# Patient Record
Sex: Female | Born: 1955 | Race: White | Hispanic: No | Smoking: Former smoker
Health system: Southern US, Community
[De-identification: ages and names within clinical notes are randomized; demographics above are authoritative.]

## PROBLEM LIST (undated history)

## (undated) DIAGNOSIS — Z789 Other specified health status: Secondary | ICD-10-CM

## (undated) DIAGNOSIS — E559 Vitamin D deficiency, unspecified: Secondary | ICD-10-CM

## (undated) DIAGNOSIS — R7303 Prediabetes: Secondary | ICD-10-CM

## (undated) DIAGNOSIS — R600 Localized edema: Secondary | ICD-10-CM

## (undated) DIAGNOSIS — D649 Anemia, unspecified: Secondary | ICD-10-CM

## (undated) DIAGNOSIS — K219 Gastro-esophageal reflux disease without esophagitis: Secondary | ICD-10-CM

## (undated) HISTORY — DX: Vitamin D deficiency, unspecified: E55.9

## (undated) HISTORY — PX: COLONOSCOPY: SHX174

## (undated) HISTORY — PX: HEMORROIDECTOMY: SUR656

## (undated) HISTORY — PX: TONSILLECTOMY: SUR1361

## (undated) HISTORY — PX: BREAST EXCISIONAL BIOPSY: SUR124

---

## 1977-11-16 HISTORY — PX: BREAST SURGERY: SHX581

## 2001-06-24 ENCOUNTER — Other Ambulatory Visit: Admission: RE | Admit: 2001-06-24 | Discharge: 2001-06-24 | Payer: Self-pay | Admitting: Gynecology

## 2001-11-16 HISTORY — PX: ABDOMINAL HYSTERECTOMY: SHX81

## 2001-11-23 ENCOUNTER — Inpatient Hospital Stay (HOSPITAL_COMMUNITY): Admission: RE | Admit: 2001-11-23 | Discharge: 2001-11-25 | Payer: Self-pay | Admitting: Gynecology

## 2001-11-23 ENCOUNTER — Encounter (INDEPENDENT_AMBULATORY_CARE_PROVIDER_SITE_OTHER): Payer: Self-pay | Admitting: Specialist

## 2002-06-22 ENCOUNTER — Encounter: Admission: RE | Admit: 2002-06-22 | Discharge: 2002-06-22 | Payer: Self-pay | Admitting: Gynecology

## 2002-06-22 ENCOUNTER — Encounter: Payer: Self-pay | Admitting: Gynecology

## 2002-07-07 ENCOUNTER — Other Ambulatory Visit: Admission: RE | Admit: 2002-07-07 | Discharge: 2002-07-07 | Payer: Self-pay | Admitting: Gynecology

## 2003-06-22 ENCOUNTER — Other Ambulatory Visit: Admission: RE | Admit: 2003-06-22 | Discharge: 2003-06-22 | Payer: Self-pay | Admitting: Gynecology

## 2003-10-16 ENCOUNTER — Encounter: Admission: RE | Admit: 2003-10-16 | Discharge: 2003-10-16 | Payer: Self-pay | Admitting: Gynecology

## 2004-10-22 ENCOUNTER — Other Ambulatory Visit: Admission: RE | Admit: 2004-10-22 | Discharge: 2004-10-22 | Payer: Self-pay | Admitting: Gynecology

## 2004-11-26 ENCOUNTER — Encounter: Admission: RE | Admit: 2004-11-26 | Discharge: 2004-11-26 | Payer: Self-pay | Admitting: Gynecology

## 2005-11-19 ENCOUNTER — Other Ambulatory Visit: Admission: RE | Admit: 2005-11-19 | Discharge: 2005-11-19 | Payer: Self-pay | Admitting: Gynecology

## 2005-12-15 ENCOUNTER — Encounter: Admission: RE | Admit: 2005-12-15 | Discharge: 2005-12-15 | Payer: Self-pay | Admitting: Gynecology

## 2006-12-17 ENCOUNTER — Other Ambulatory Visit: Admission: RE | Admit: 2006-12-17 | Discharge: 2006-12-17 | Payer: Self-pay | Admitting: Gynecology

## 2007-02-28 ENCOUNTER — Encounter: Admission: RE | Admit: 2007-02-28 | Discharge: 2007-02-28 | Payer: Self-pay | Admitting: Gynecology

## 2007-03-03 ENCOUNTER — Encounter: Admission: RE | Admit: 2007-03-03 | Discharge: 2007-03-03 | Payer: Self-pay | Admitting: Gynecology

## 2008-03-12 ENCOUNTER — Encounter: Admission: RE | Admit: 2008-03-12 | Discharge: 2008-03-12 | Payer: Self-pay | Admitting: Gynecology

## 2009-03-14 ENCOUNTER — Encounter: Admission: RE | Admit: 2009-03-14 | Discharge: 2009-03-14 | Payer: Self-pay | Admitting: Gynecology

## 2010-03-17 ENCOUNTER — Encounter: Admission: RE | Admit: 2010-03-17 | Discharge: 2010-03-17 | Payer: Self-pay | Admitting: Gynecology

## 2011-03-17 ENCOUNTER — Other Ambulatory Visit: Payer: Self-pay | Admitting: Gynecology

## 2011-03-17 DIAGNOSIS — Z1231 Encounter for screening mammogram for malignant neoplasm of breast: Secondary | ICD-10-CM

## 2011-03-24 ENCOUNTER — Other Ambulatory Visit: Payer: Self-pay | Admitting: Gynecology

## 2011-04-03 ENCOUNTER — Ambulatory Visit: Payer: Self-pay

## 2011-04-03 NOTE — Discharge Summary (Signed)
[facility]  Patient:    Donna Baker, RYLE Visit Number: [id number] MRN: [id number]          Service Type: Attending:  Gretta Cool, M.D. Dictated by:   Jeani Sow, F.N.P. Adm. Date:  11/23/01 Disc. Date: 11/25/01   CC:         [facility]   Discharge Summary  HISTORY OF PRESENT ILLNESS:  The patient is a 55 year old female, gravida 3, para 3, AB 1, with a history of increasingly severe menorrhagia with extremely heavy periods for three days associated with severe cramping.  She also reports bladder pressure, frequency, nocturia, which developed over the last six months.  Ultrasound evaluation documented a very large luminal leiomyomata bulging into the cavity, and accessible by hysteroscopy.  The uterus is approximately 12 to 14 weeks in size, grossly irregular, with a large posterior wall myoma.  She wishes to proceed with hysterectomy for definitive therapy, but wishes to preserve ovarian function.  PHYSICAL EXAMINATION:  CHEST:  Clear to auscultation and percussion.  HEART:  Regular rate and rhythm, without murmur, gallop, or cardiac enlargement.  ABDOMEN:  Soft and scaphoid without masses or organomegaly.  PELVIC:  External genitalia within normal limits for female.  Vagina clean and rugous.  Excellent pelvic support.  Cervix is clean.  Uterus is approximately 12 weeks in size, grossly irregular with fibroids deviating the uterus posteriorly.  Adnexa nonpalpable.  Rectovaginal examination confirms.  IMPRESSION: 1. Uterine leiomyomata with abnormal uterine bleeding. 2. Nocturia frequently. 3. Luminal by ultrasound.  PLAN:  Total abdominal hysterectomy, possible salpingo-oophorectomy.  Risks and benefits were discussed with the patient, and she accepts these procedures.  LABORATORY DATA:  Admission hemoglobin 11.0, hematocrit 32.0.  On the first postoperative day, hemoglobin was 8.6, hematocrit 25.4.  Blood type  A-, with a negative antibody screen.  HOSPITAL COURSE:  The patient underwent total abdominal hysterectomy under general anesthesia.  The procedure was completed without any complications, and the patient was returned to the recovery room in excellent condition. Pathology report showed benign proliferative phase endometrium with a benign endometrial polyp, leiomyomata and adenomyosis, serosal fibrous adhesions. Her postoperative course was without complications, and the patient was discharged to home on the second postoperative day in excellent condition.  It was noted on the day of discharge the hemoglobin had dropped more than expected with estimated loss at surgery.  The patient was not pale, and was not having any difficulties, so she was discharged to home.  ACTIVITY:  No heavy lifting or straining, no vaginal entrance, increase ambulation as tolerated.  DISCHARGE INSTRUCTIONS:  She is to call for fever of over 100.5, or failure of daily improvement.  DIET:  Regular.  DISCHARGE MEDICATIONS: 1. Tylox one p.o. q.4h. p.r.n. discomfort. 2. Vioxx 25 mg q.d.  FOLLOWUP:  She is to return to the office in one week for followup.  CONDITION ON DISCHARGE:  Excellent.  FINAL DISCHARGE DIAGNOSES: 1. Uterine leiomyomata with abnormal uterine bleeding, luminal. 2. Benign endometrial polyp. 3. Adenomyosis and serosal fibrous adhesions.  PROCEDURES PERFORMED:  Total abdominal hysterectomy under general anesthesia. Dictated by:   Jeani Sow, F.N.P. Attending:  Gretta Cool, M.D. DD:  12/12/01 TD:  12/12/01 Job: [id number]

## 2011-04-03 NOTE — H&P (Signed)
[facility]  Patient:    Donna Baker, Donna Baker Visit Number: [id number] MRN: [id number]          Service Type: Attending:  Gretta Cool, M.D. Dictated by:   Gretta Cool, M.D. Adm. Date:  11/24/01   CC:         [facility]   History and Physical  CHIEF COMPLAINT: 1. Abdomen uterine bleeding. 2. Pelvic pressure. 3. Nocturia frequency.  HISTORY OF PRESENT ILLNESS:  This is a 55 year old gravida 3, para 2, ab 1 with a history of increasing severe menorrhagia with extremely heavy bleeding for the first three days of her cycle associated with severe cramps.  She is also having bladder pressure, frequency, nocturia, which developed over the last 6 months.  She has ultrasound documentation of a very large luminal leiomyoma bulging into the cavity and accessible by hysteroscopy.  Her uterus is approximately 12-14 weeks size, grossly irregular, with a large posterior wall myoma.  She wishes to proceed to hysterectomy for definitive therapy, wishes ovarian preservation.  She understands the alternative forms of therapy including medical suppression, embolization etc.  PAST MEDICAL HISTORY:  Usual childhood diseases without sequelae.  MEDICAL ILLNESSES:  None of consequence.  ACCIDENTS/INJURIES:  None.  ALLERGIES:  None.  PREVIOUS SURGERIES: 1. C-section x 2. 2. Hemorrhoidectomy, Dr. Earlene Plater. 3. T&A as a child.  FAMILY HISTORY:  Father died of liver cancer at age 61.  Mother is 87, living and well.  No other known familial tendency to disease.  SOCIAL HISTORY:  Denies ethanol, tobacco, or recreational drugs.  She is a divorced mother of two, with one child living at home.  REVIEW OF SYSTEMS:  HEENT:  Denies symptoms.  CARDIORESPIRATORY:  Denies asthma, cough, bronchitis, shortness of breath.  GI/GU:  Denies frequency, urgency, change in bowel habits, food intolerance.  There is nocturia as above.  PHYSICAL  EXAMINATION:  GENERAL:  Well-developed, short, white female, moderately over ideal weight.  HEENT:  Pupils, equal, round, reactive to light and accommodation.  Fundi are benign.  Oropharynx is clear.  NECK:  Supple without masses or thyroid enlargement.  CHEST:  Clear to percussion and auscultation.  HEART:  Regular rhythm without murmur or cardiac enlargement.  BREASTS:  Soft without mass, nodes, nipple discharge.  Node bearing areas are negative.  ABDOMEN:  Soft, scaphoid without mass or organomegaly.  PELVIC:  External genitalia:  Normal female.  Vagina:  Clean and rugous, excellent pelvic support.  CERVIX:  Clean.  UTERUS:  Approximately 12 weeks size, grossly irregular with fibroids deviating the uterus posteriorly.  Adnexa nonpalpable.  Rectovaginal examination confirms.  IMPRESSION: 1. Uterine leiomyomata with abnormal uterine bleeding.  Nocturia frequency. 2. Luminal by ultrasound  PLAN:  Total abdominal hysterectomy, possible salpingo-oophorectomy.  NOTE:  She wishes ovarian preservation if possible.  Risks, benefit ratio discussed in detail.  Alternative therapies. Dictated by:   Gretta Cool, M.D. Attending:  Gretta Cool, M.D. DD:  11/24/01 TD:  11/25/01 Job: [id number]

## 2011-04-03 NOTE — Op Note (Signed)
[facility]  Patient:    Donna Baker, Donna Baker [facility] Visit Number: [id number] MRN: [id number]          Service Type: GYN Location: 4W [id number] 01 Attending Physician:  Katrina Stack Dictated by:   Gretta Cool, M.D. Proc. Date: 11/23/01 Admit Date:  11/23/2001   CC:         [facility]   Operative Report  PREOPERATIVE DIAGNOSIS:  Uterine leiomyomata with abnormal uterine bleeding, luminal.  POSTOPERATIVE DIAGNOSIS:  Uterine leiomyomata with abnormal uterine bleeding, luminal.  PROCEDURES:  Total abdominal hysterectomy.  SURGEON:  Gretta Cool, M.D.  ASSISTANT:  Raynald Kemp, M.D.  ANESTHESIA:  General orotracheal.  DESCRIPTION OF PROCEDURE:  Under excellent general anesthesia with the patients abdomen prepped and draped as a sterile field and Foley catheter draining the bladder, a Pfannenstiel incision was made by excision of her previous Pfannenstiel scar.  The incision was extended through the fascia. The peritoneum was opened and the abdomen explored.  No abnormalities identified in the upper abdomen.  Examination of the pelvis revealed a huge posterior wall myoma and a smaller one on the right lateral uterine wall. There was minimal adhesion from previous cesarean sections, multiple.  The round ligaments were then sutured and transected.  The anterior leaf of the broad ligament was opened and the bladder pushed off the lower segment.  The ovarian ligaments were then clamped, cut, sutured, and tied with 0 Vicryl. The uterine vessels were then skeletonized, clamped, cut, and tied.  The cardinal uterosacral ligaments were then clamped, cut, sutured, and tied with 0 Vicryl.  The uterosacral ligaments were then clamped, cut, sutured, and tied with 0 Vicryl.  The cervix was then excised.  The cuff was closed with a running suture of 0 Vicryl.  At this point, the pelvic floor was reperitonealized with a running suture  of 2-0 Monocryl.  The packs and retractors were then removed.  The pelvis was irrigated.  The abdominal peritoneum was closed with a running suture of 0 Monocryl.  The fascia was approximated with interrupted sutures of 0 Vicryl.  The subcutaneous tissues were approximated with interrupted sutures of 3-0 Vicryl.  The skin was sutured with subcuticular 5-0 Vicryl and skin staples and Steri-Strips.  At the end of the procedure, the sponge and lap counts were correct.  No complications.  The patient returned to the recovery room in excellent condition. Dictated by:   Gretta Cool, M.D. Attending Physician:  Katrina Stack DD:  11/23/01 TD:  11/23/01 Job: [id number]

## 2011-04-15 ENCOUNTER — Ambulatory Visit
Admission: RE | Admit: 2011-04-15 | Discharge: 2011-04-15 | Disposition: A | Discharge status: Home / Self Care | Payer: PRIVATE HEALTH INSURANCE | Source: Ambulatory Visit | Attending: Gynecology | Admitting: Gynecology

## 2011-04-15 DIAGNOSIS — Z1231 Encounter for screening mammogram for malignant neoplasm of breast: Secondary | ICD-10-CM

## 2012-03-30 ENCOUNTER — Other Ambulatory Visit: Payer: Self-pay | Admitting: Gynecology

## 2012-03-30 DIAGNOSIS — Z1231 Encounter for screening mammogram for malignant neoplasm of breast: Secondary | ICD-10-CM

## 2012-04-25 ENCOUNTER — Ambulatory Visit: Payer: PRIVATE HEALTH INSURANCE

## 2012-04-27 ENCOUNTER — Ambulatory Visit: Payer: PRIVATE HEALTH INSURANCE

## 2012-05-13 ENCOUNTER — Ambulatory Visit
Admission: RE | Admit: 2012-05-13 | Discharge: 2012-05-13 | Disposition: A | Discharge status: Home / Self Care | Payer: No Typology Code available for payment source | Source: Ambulatory Visit | Attending: Gynecology | Admitting: Gynecology

## 2012-05-13 DIAGNOSIS — Z1231 Encounter for screening mammogram for malignant neoplasm of breast: Secondary | ICD-10-CM

## 2012-06-02 ENCOUNTER — Other Ambulatory Visit: Payer: Self-pay | Admitting: Gynecology

## 2012-06-15 ENCOUNTER — Other Ambulatory Visit: Payer: Self-pay | Admitting: Gastroenterology

## 2012-07-20 ENCOUNTER — Other Ambulatory Visit: Payer: Self-pay | Admitting: Gynecology

## 2012-07-20 DIAGNOSIS — N644 Mastodynia: Secondary | ICD-10-CM

## 2012-07-20 DIAGNOSIS — N63 Unspecified lump in unspecified breast: Secondary | ICD-10-CM

## 2012-07-22 ENCOUNTER — Ambulatory Visit
Admission: RE | Admit: 2012-07-22 | Discharge: 2012-07-22 | Disposition: A | Discharge status: Home / Self Care | Payer: No Typology Code available for payment source | Source: Ambulatory Visit | Attending: Gynecology | Admitting: Gynecology

## 2012-07-22 DIAGNOSIS — N644 Mastodynia: Secondary | ICD-10-CM

## 2012-07-22 DIAGNOSIS — N63 Unspecified lump in unspecified breast: Secondary | ICD-10-CM

## 2012-08-16 ENCOUNTER — Encounter (INDEPENDENT_AMBULATORY_CARE_PROVIDER_SITE_OTHER): Payer: Self-pay | Admitting: Surgery

## 2012-08-16 ENCOUNTER — Ambulatory Visit (INDEPENDENT_AMBULATORY_CARE_PROVIDER_SITE_OTHER): Payer: No Typology Code available for payment source | Admitting: Surgery

## 2012-08-16 VITALS — BP 118/62 | HR 73 | Temp 98.2°F | Resp 18 | Ht 60.0 in | Wt 136.4 lb

## 2012-08-16 DIAGNOSIS — N6019 Diffuse cystic mastopathy of unspecified breast: Secondary | ICD-10-CM | POA: Insufficient documentation

## 2012-08-16 DIAGNOSIS — N644 Mastodynia: Secondary | ICD-10-CM

## 2012-08-16 NOTE — Progress Notes (Signed)
NAME: Donna Baker DOB: 1956/08/13 MRN: [id number]                                                                                      DATE: 08/16/2012  PCP: Gretta Cool, MD Referring Provider: Gretta Cool, MD  IMPRESSION:  Fibrocystic disease with prominent area in right breast upper outer quadrant  PLAN:   I told her I think that this is a benign area of fibrocystic change. It is not much different that wasn't my exam several years ago. Negative mammogram, ultrasound, and 3-D mammogram all make me feel more comfortable that this is benign.I feel will be okay for her gynecologist put her on some medications to help control her hot flashes.                CC:  Chief Complaint  Patient presents with  . Breast Pain    Right    HPI:  Donna Baker is a 56 y.o.  female who presents for evaluation of a possible right breast mass. I saw her in 2000 and on for a possible right breast mass upper outer quadrant. The area has been stable as far as the patient can tell. However she recent had some increasing pain in that area and has been further evaluated. In June she had a toe mammogram that was negative. In September she had ultrasound and a new right-sided mammogram both of which are negative. She notes her pain has gotten better. Her gynecologist continues to note an area about 3 cm in the 10:00 position that feels different than the rest of her breasts.  PMH:  has no past medical history on file.  PSH:   has past surgical history that includes Abdominal hysterectomy; Cesarean section; and Breast surgery.  ALLERGIES:  Not on File  MEDICATIONS: Current outpatient prescriptions:triamterene-hydrochlorothiazide (MAXZIDE) 75-50 MG per tablet, Take 1 tablet by mouth once a week., Disp: , Rfl:   ROS: She has filled out our 12 point review of systems and it is negative. EXAM:   Vital signs:BP 118/62  Pulse 73  Temp 98.2 F (36.8 C) (Temporal)  Resp 18  Ht 5' (1.524 m)  Wt  136 lb 6.4 oz (61.871 kg)  BMI 26.64 kg/m2  SpO2 97%  General: Patient alert oriented healthy-appearing, NAD.  Breasts: The breasts are symmetric and overall appearance. There is a small ecchymosis perhaps 2 cm high in the right breast( the patient relates this to some deep tissue massage she received over the weekend). There is some increased nodularity in the clock position as previously documented and noted by her gynecologist. I think this represents fibrocystic change. There no areas that are particularly hard. The skin and nipple areas are normal bilaterally. Lymphatics: There is no axillary supraclavicular adenopathy noted.  DATA REVIEWED:  I reviewed the notes from Dr. Nicholas Lose. In addition I reviewed the mammogram and all she reports as well as my old office notes.    Aarin Sparkman J 08/16/2012  CC: Gretta Cool, MD, Gretta Cool, MD

## 2012-08-16 NOTE — Patient Instructions (Signed)
If you notice any other changes in your breast come back for another visit

## 2013-01-17 ENCOUNTER — Encounter (HOSPITAL_BASED_OUTPATIENT_CLINIC_OR_DEPARTMENT_OTHER): Payer: Self-pay | Admitting: *Deleted

## 2013-01-17 NOTE — Progress Notes (Signed)
Pt takes maxide once a month for leg edema if she flighs-did not order istat -

## 2013-01-17 NOTE — Progress Notes (Signed)
No cardiac or resp problems 

## 2013-01-18 NOTE — H&P (Signed)
[facility] [address]   [address] [place], [place] [id number] [phone] A Division of [facility]  Loreta Ave, M.D.   Robert A. Thurston Hole, M.D.   Burnell Blanks, M.D.   Eulas Post, M.D.   Lunette Stands, M.D Buford Dresser, M.D.  Charlsie Quest, M.D.   Estell Harpin, M.D.   Melina Fiddler, M.D. Genene Churn. Barry Dienes, PA-C            Kirstin A. Shepperson, PA-C Josh Amazonia, PA-C [place], [place]   RE: Rosmery, [place]                                [id number]      DOB: 21-May-1956 INITIAL EVALUATION:  09-23-12 Chief complaint: Right shoulder pain.  History of present illness: 57 year-old white female who is a new patient to the office and presents with the above complaint.  Shoulder pain ongoing for a couple of months.  Denies previous issues before onset.  No specific injury that she can recall.  No cervical spine or radicular component.  Pain aggravated with overhead activity and reaching behind her back.  She is very active with working out at [employer], but has been limited over the last several weeks.  She states "if I could just get something to support my shoulder".  Most of her discomfort is localized to the anterior aspect, but does have lateral discomfort.  No feeling of instability.  Pain does awaken her at night when she lies on her right side.   Current medications: Aleve. Allergies: No known drug allergies. Past medical/surgical history: Hysterectomy. Review of systems: All other systems unremarkable. Family history: Healthy. Social history: Denies smoking, admits occasional alcohol use.  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      EXAMINATION: Height: 5?0.  Weight: 134 pounds.  Pulse: 71.  Blood pressure: 120/75.  Pleasant white female, alert and oriented x 3 and in no acute distress.  Gait is normal.  Cervical spine good range of motion.  Right shoulder she  has good range of motion , but with discomfort overhead and with internal rotation behind her back.  Positive impingement test.  Tender over the proximal biceps tendon without defect.  Negative drop arm.  Pain and some weakness with supraspinatus resistance.  Neurovascularly intact.  Skin warm and dry.     X-RAYS: Right shoulder, AP, outlet and axillary views, show some AC DJD and a large subacromial spur which could be an extra acromion.    IMPRESSION: Right shoulder pain secondary to impingement with large subacromial spur.    PLAN: Since her pain has only been going on for a couple of months we will attempt conservative treatment with injection.  She is advised to pay close attention to how she feels over the next several hours.  Follow up in 3-4 weeks for recheck, but if she is not any better before that appointment she will call and let me know and I will schedule an MRI scan.  All questions answered.    Continued   Ioanna Colquhoun/[facility] [address]   [address] [place], [place] [address] [phone] A Division of [facility]  Loreta Ave, M.D.   Robert A. Thurston Hole, M.D.   Burnell Blanks, M.D.   Eulas Post, M.D.   Lunette Stands, M.D  Buford Dresser, M.D.  Charlsie Quest, M.D.   Estell Harpin, M.D.   Melina Fiddler, M.D. Genene Churn. Barry Dienes, PA-C            Kirstin A. Shepperson, PA-C Josh Bastrop, PA-C [place], [place]   RE: Brande, [place]                                [id number]      DOB: [birth year] PROGRESS NOTE: 09-23-12 PROCEDURE NOTE: The patient's clinical condition is marked by substantial pain and/or significant functional disability.  Other conservative therapy has not provided relief, is contraindicated, or not appropriate.  There is a reasonable likelihood that injection will significantly improve the patient's pain and/or functional disability. After patient consent the right shoulder was prepped  with Betadine after using 3 cc of 1% Xylocaine for local anesthetic, subacromial 1:5 Depo-Medrol/Marcaine injection performed from a posterolateral approach.  Tolerated procedure well without complication.   Loreta Ave, M.D.   Electronically verified by Loreta Ave, M.D. DFM(JMO):jjh D 09-26-12 T 09-27-12  MRI completed 01/11/13 showing small full thickness tear supraspinatous tendon, impingement, bursitis. Persistent symptoms. Proceeding with scope, decompression and cuff repair.

## 2013-01-19 ENCOUNTER — Encounter (HOSPITAL_BASED_OUTPATIENT_CLINIC_OR_DEPARTMENT_OTHER)
Admission: RE | Disposition: A | Discharge status: Home / Self Care | Payer: Self-pay | Source: Ambulatory Visit | Attending: Orthopedic Surgery

## 2013-01-19 ENCOUNTER — Ambulatory Visit (HOSPITAL_BASED_OUTPATIENT_CLINIC_OR_DEPARTMENT_OTHER): Payer: No Typology Code available for payment source | Admitting: Anesthesiology

## 2013-01-19 ENCOUNTER — Encounter (HOSPITAL_BASED_OUTPATIENT_CLINIC_OR_DEPARTMENT_OTHER): Payer: Self-pay | Admitting: Anesthesiology

## 2013-01-19 ENCOUNTER — Ambulatory Visit (HOSPITAL_BASED_OUTPATIENT_CLINIC_OR_DEPARTMENT_OTHER)
Admission: RE | Admit: 2013-01-19 | Discharge: 2013-01-19 | Disposition: A | Discharge status: Home / Self Care | Payer: No Typology Code available for payment source | Source: Ambulatory Visit | Attending: Orthopedic Surgery | Admitting: Orthopedic Surgery

## 2013-01-19 DIAGNOSIS — M719 Bursopathy, unspecified: Secondary | ICD-10-CM | POA: Insufficient documentation

## 2013-01-19 DIAGNOSIS — M67919 Unspecified disorder of synovium and tendon, unspecified shoulder: Secondary | ICD-10-CM | POA: Insufficient documentation

## 2013-01-19 DIAGNOSIS — M24819 Other specific joint derangements of unspecified shoulder, not elsewhere classified: Secondary | ICD-10-CM | POA: Insufficient documentation

## 2013-01-19 DIAGNOSIS — Z9889 Other specified postprocedural states: Secondary | ICD-10-CM

## 2013-01-19 DIAGNOSIS — M25819 Other specified joint disorders, unspecified shoulder: Secondary | ICD-10-CM | POA: Insufficient documentation

## 2013-01-19 HISTORY — DX: Localized edema: R60.0

## 2013-01-19 HISTORY — PX: SHOULDER ARTHROSCOPY WITH ROTATOR CUFF REPAIR AND SUBACROMIAL DECOMPRESSION: SHX5686

## 2013-01-19 HISTORY — DX: Other specified health status: Z78.9

## 2013-01-19 LAB — POCT HEMOGLOBIN-HEMACUE: Hemoglobin: 14.3 g/dL (ref 12.0–15.0)

## 2013-01-19 SURGERY — SHOULDER ARTHROSCOPY WITH ROTATOR CUFF REPAIR AND SUBACROMIAL DECOMPRESSION
Anesthesia: General | Site: Shoulder | Laterality: Right | Wound class: Clean

## 2013-01-19 MED ORDER — PROPOFOL 10 MG/ML IV BOLUS
INTRAVENOUS | Status: DC | PRN
  Administered 2013-01-19: 140 mg via INTRAVENOUS

## 2013-01-19 MED ORDER — OXYCODONE HCL 5 MG/5ML PO SOLN: 5.0000 mg | Freq: Once | ORAL | Status: DC | PRN

## 2013-01-19 MED ORDER — DEXAMETHASONE SODIUM PHOSPHATE 4 MG/ML IJ SOLN
INTRAMUSCULAR | Status: DC | PRN
  Administered 2013-01-19: 10 mg via INTRAVENOUS

## 2013-01-19 MED ORDER — OXYCODONE HCL 5 MG PO TABS: 5.0000 mg | ORAL_TABLET | Freq: Once | ORAL | Status: DC | PRN

## 2013-01-19 MED ORDER — LIDOCAINE HCL (CARDIAC) 20 MG/ML IV SOLN
INTRAVENOUS | Status: DC | PRN
  Administered 2013-01-19: 40 mg via INTRAVENOUS

## 2013-01-19 MED ORDER — CEFAZOLIN SODIUM-DEXTROSE 2-3 GM-% IV SOLR
2.0000 g | INTRAVENOUS | Status: AC
Start: 2013-01-19 — End: 2013-01-19
  Administered 2013-01-19: 2 g via INTRAVENOUS

## 2013-01-19 MED ORDER — SUCCINYLCHOLINE CHLORIDE 20 MG/ML IJ SOLN
INTRAMUSCULAR | Status: DC | PRN
  Administered 2013-01-19: 80 mg via INTRAVENOUS

## 2013-01-19 MED ORDER — BUPIVACAINE-EPINEPHRINE PF 0.5-1:200000 % IJ SOLN
INTRAMUSCULAR | Status: DC | PRN
  Administered 2013-01-19: 25 mL

## 2013-01-19 MED ORDER — MIDAZOLAM HCL 2 MG/2ML IJ SOLN
1.0000 mg | INTRAMUSCULAR | Status: DC | PRN
  Administered 2013-01-19: 2 mg via INTRAVENOUS

## 2013-01-19 MED ORDER — OXYCODONE-ACETAMINOPHEN 5-325 MG PO TABS: 1.0000 | ORAL_TABLET | ORAL | Status: DC | PRN

## 2013-01-19 MED ORDER — SODIUM CHLORIDE 0.9 % IR SOLN
Status: DC | PRN
  Administered 2013-01-19: 6000 mL

## 2013-01-19 MED ORDER — HYDROMORPHONE HCL PF 1 MG/ML IJ SOLN
0.2500 mg | INTRAMUSCULAR | Status: DC | PRN
  Administered 2013-01-19 (×2): 0.5 mg via INTRAVENOUS

## 2013-01-19 MED ORDER — FENTANYL CITRATE 0.05 MG/ML IJ SOLN
50.0000 ug | INTRAMUSCULAR | Status: DC | PRN
  Administered 2013-01-19: 100 ug via INTRAVENOUS

## 2013-01-19 MED ORDER — ONDANSETRON HCL 4 MG/2ML IJ SOLN
INTRAMUSCULAR | Status: DC | PRN
  Administered 2013-01-19: 4 mg via INTRAVENOUS

## 2013-01-19 MED ORDER — LACTATED RINGERS IV SOLN
INTRAVENOUS | Status: DC
  Administered 2013-01-19 (×2): via INTRAVENOUS

## 2013-01-19 SURGICAL SUPPLY — 73 items
ANCH SUT SWLK 19.1X5.5 CLS EL (Anchor) ×2 IMPLANT
ANCHOR PEEK SWIVEL LOCK 5.5 (Anchor) ×2 IMPLANT
APL SKNCLS STERI-STRIP NONHPOA (GAUZE/BANDAGES/DRESSINGS)
BENZOIN TINCTURE PRP APPL 2/3 (GAUZE/BANDAGES/DRESSINGS) IMPLANT
BLADE CUTTER GATOR 3.5 (BLADE) ×2 IMPLANT
BLADE CUTTER MENIS 5.5 (BLADE) IMPLANT
BLADE GREAT WHITE 4.2 (BLADE) ×2 IMPLANT
BLADE SURG 15 STRL LF DISP TIS (BLADE) IMPLANT
BLADE SURG 15 STRL SS (BLADE)
BUR OVAL 6.0 (BURR) ×2 IMPLANT
CANISTER OMNI JUG 16 LITER (MISCELLANEOUS) ×2 IMPLANT
CANISTER SUCTION 2500CC (MISCELLANEOUS) IMPLANT
CANNULA DRY DOC 8X75 (CANNULA) ×1 IMPLANT
CANNULA TWIST IN 8.25X7CM (CANNULA) IMPLANT
CLOTH BEACON ORANGE TIMEOUT ST (SAFETY) ×2 IMPLANT
DECANTER SPIKE VIAL GLASS SM (MISCELLANEOUS) IMPLANT
DRAPE OEC MINIVIEW 54X84 (DRAPES) IMPLANT
DRAPE STERI 35X30 U-POUCH (DRAPES) ×2 IMPLANT
DRAPE U-SHAPE 47X51 STRL (DRAPES) ×2 IMPLANT
DRAPE U-SHAPE 76X120 STRL (DRAPES) ×4 IMPLANT
DRSG PAD ABDOMINAL 8X10 ST (GAUZE/BANDAGES/DRESSINGS) ×2 IMPLANT
DURAPREP 26ML APPLICATOR (WOUND CARE) ×2 IMPLANT
ELECT MENISCUS 165MM 90D (ELECTRODE) ×2 IMPLANT
ELECT NDL TIP 2.8 STRL (NEEDLE) IMPLANT
ELECT NEEDLE TIP 2.8 STRL (NEEDLE) IMPLANT
ELECT REM PT RETURN 9FT ADLT (ELECTROSURGICAL) ×2
ELECTRODE REM PT RTRN 9FT ADLT (ELECTROSURGICAL) ×1 IMPLANT
GAUZE XEROFORM 1X8 LF (GAUZE/BANDAGES/DRESSINGS) ×2 IMPLANT
GLOVE BIO SURGEON STRL SZ 6.5 (GLOVE) ×1 IMPLANT
GLOVE BIOGEL PI IND STRL 7.0 (GLOVE) IMPLANT
GLOVE BIOGEL PI IND STRL 8 (GLOVE) ×1 IMPLANT
GLOVE BIOGEL PI INDICATOR 7.0 (GLOVE) ×1
GLOVE BIOGEL PI INDICATOR 8 (GLOVE) ×1
GLOVE ORTHO TXT STRL SZ7.5 (GLOVE) ×4 IMPLANT
GOWN PREVENTION PLUS XLARGE (GOWN DISPOSABLE) ×2 IMPLANT
GOWN STRL REIN 2XL XLG LVL4 (GOWN DISPOSABLE) ×2 IMPLANT
NDL SCORPION MULTI FIRE (NEEDLE) IMPLANT
NDL SUT 6 .5 CRC .975X.05 MAYO (NEEDLE) IMPLANT
NEEDLE MAYO TAPER (NEEDLE)
NEEDLE SCORPION MULTI FIRE (NEEDLE) ×2 IMPLANT
NS IRRIG 1000ML POUR BTL (IV SOLUTION) IMPLANT
PACK ARTHROSCOPY DSU (CUSTOM PROCEDURE TRAY) ×2 IMPLANT
PACK BASIN DAY SURGERY FS (CUSTOM PROCEDURE TRAY) ×2 IMPLANT
PASSER SUT SWANSON 36MM LOOP (INSTRUMENTS) IMPLANT
PENCIL BUTTON HOLSTER BLD 10FT (ELECTRODE) ×2 IMPLANT
SET ARTHROSCOPY TUBING (MISCELLANEOUS) ×2
SET ARTHROSCOPY TUBING LN (MISCELLANEOUS) ×1 IMPLANT
SLEEVE SCD COMPRESS KNEE MED (MISCELLANEOUS) ×1 IMPLANT
SLING ARM FOAM STRAP LRG (SOFTGOODS) IMPLANT
SLING ARM FOAM STRAP MED (SOFTGOODS) IMPLANT
SLING ARM FOAM STRAP XLG (SOFTGOODS) IMPLANT
SLING ARM IMMOBILIZER LRG (SOFTGOODS) IMPLANT
SLING ARM IMMOBILIZER MED (SOFTGOODS) IMPLANT
SPONGE GAUZE 4X4 12PLY (GAUZE/BANDAGES/DRESSINGS) ×4 IMPLANT
SPONGE LAP 4X18 X RAY DECT (DISPOSABLE) IMPLANT
STRIP CLOSURE SKIN 1/2X4 (GAUZE/BANDAGES/DRESSINGS) IMPLANT
SUCTION FRAZIER TIP 10 FR DISP (SUCTIONS) IMPLANT
SUT ETHIBOND 2 OS 4 DA (SUTURE) IMPLANT
SUT ETHILON 2 0 FS 18 (SUTURE) IMPLANT
SUT ETHILON 3 0 PS 1 (SUTURE) IMPLANT
SUT FIBERWIRE #2 38 T-5 BLUE (SUTURE)
SUT RETRIEVER MED (INSTRUMENTS) IMPLANT
SUT TIGER TAPE 7 IN WHITE (SUTURE) ×1 IMPLANT
SUT VIC AB 0 CT1 27 (SUTURE)
SUT VIC AB 0 CT1 27XBRD ANBCTR (SUTURE) IMPLANT
SUT VIC AB 2-0 SH 27 (SUTURE)
SUT VIC AB 2-0 SH 27XBRD (SUTURE) IMPLANT
SUT VIC AB 3-0 FS2 27 (SUTURE) IMPLANT
SUTURE FIBERWR #2 38 T-5 BLUE (SUTURE) IMPLANT
TAPE FIBER 2MM 7IN #2 BLUE (SUTURE) ×1 IMPLANT
TOWEL OR 17X24 6PK STRL BLUE (TOWEL DISPOSABLE) ×2 IMPLANT
WATER STERILE IRR 1000ML POUR (IV SOLUTION) ×2 IMPLANT
YANKAUER SUCT BULB TIP NO VENT (SUCTIONS) IMPLANT

## 2013-01-19 NOTE — Interval H&P Note (Signed)
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 After consideration of risks, benefits and other options for treatment, the patient has consented to  Procedure(s) with comments: RIGHT SHOULDER ARTHROSCOPY DECOMPRESSION SUBACROMIAL PARTIAL ACROMIOPLASTY WITH CORACOACROMIAL RELEASE, ARTHROSCOPY SHOULDER DISTAL CLAVICULECTOMY, ARTHROSCOPY SHOULDER WITH ROTATOR CUFF REPAIR  (Right) - RIGHT SHOULDER SCOPE, DISTAL CLAVICAL EXCISION, ACROMIOPLASTY, ROTATOR CUFF REPAIR, POSSIBLE BICEPS TENDON RELEASE.  ANESTHESIA: GENERAL, PRE/POST OP SCALENE RIGHT SHOULDER BICEPS TENODESIS (Right) as a surgical intervention .  The patient's history has been reviewed, patient examined, no change in status, stable for surgery.  I have reviewed the patient's chart and labs.  Questions were answered to the patient's satisfaction.     Gevorg Brum F

## 2013-01-19 NOTE — Anesthesia Procedure Notes (Addendum)
Anesthesia Regional Block:  Interscalene brachial plexus block  Pre-Anesthetic Checklist: ,, timeout performed, Correct Patient, Correct Site, Correct Laterality, Correct Procedure, Correct Position, site marked, Risks and benefits discussed, pre-op evaluation,  At surgeon's request and post-op pain management  Laterality: Right  Prep: Maximum Sterile Barrier Precautions used and chloraprep       Needles:  Injection technique: Single-shot  Needle Type: Echogenic Stimulator Needle      Needle Gauge: 22 and 22 G    Additional Needles:  Procedures: ultrasound guided (picture in chart) and nerve stimulator Interscalene brachial plexus block  Nerve Stimulator or Paresthesia:  Response: Biceps response, 0.4 mA,   Additional Responses:   Narrative:  Start time: 01/19/2013 11:45 AM End time: 01/19/2013 11:55 AM Injection made incrementally with aspirations every 5 mL. Anesthesiologist: Sampson Goon, MD  Additional Notes: 2% Lidocaine skin wheel.   Interscalene brachial plexus block Procedure Name: Intubation Date/Time: 01/19/2013 12:06 PM Performed by: Caren Macadam Pre-anesthesia Checklist: Patient identified, Emergency Drugs available, Suction available and Patient being monitored Patient Re-evaluated:Patient Re-evaluated prior to inductionOxygen Delivery Method: Circle System Utilized Preoxygenation: Pre-oxygenation with 100% oxygen Intubation Type: IV induction Ventilation: Mask ventilation without difficulty Laryngoscope Size: Miller and 2 Grade View: Grade I Tube type: Oral Tube size: 7.0 mm Number of attempts: 1 Airway Equipment and Method: stylet and oral airway Placement Confirmation: ETT inserted through vocal cords under direct vision,  positive ETCO2 and breath sounds checked- equal and bilateral Secured at: 21 cm Tube secured with: Tape Dental Injury: Teeth and Oropharynx as per pre-operative assessment

## 2013-01-19 NOTE — Progress Notes (Signed)
Assisted Dr. Fitzgerald with right, ultrasound guided, interscalene  block. Side rails up, monitors on throughout procedure. See vital signs in flow sheet. Tolerated Procedure well. 

## 2013-01-19 NOTE — Anesthesia Preprocedure Evaluation (Signed)
Anesthesia Evaluation  Patient identified by MRN, date of birth, ID band Patient awake    Reviewed: Allergy & Precautions, H&P , NPO status , Patient's Chart, lab work & pertinent test results  Airway Mallampati: II TM Distance: >3 FB Neck ROM: Full    Dental no notable dental hx. (+) Teeth Intact and Dental Advisory Given   Pulmonary neg pulmonary ROS,  breath sounds clear to auscultation  Pulmonary exam normal       Cardiovascular negative cardio ROS  Rhythm:Regular Rate:Normal     Neuro/Psych negative neurological ROS  negative psych ROS   GI/Hepatic negative GI ROS, Neg liver ROS,   Endo/Other  negative endocrine ROS  Renal/GU negative Renal ROS  negative genitourinary   Musculoskeletal   Abdominal   Peds  Hematology negative hematology ROS (+)   Anesthesia Other Findings   Reproductive/Obstetrics negative OB ROS                           Anesthesia Physical Anesthesia Plan  ASA: I  Anesthesia Plan: General and Regional   Post-op Pain Management:    Induction: Intravenous  Airway Management Planned: Oral ETT  Additional Equipment:   Intra-op Plan:   Post-operative Plan: Extubation in OR  Informed Consent: I have reviewed the patients History and Physical, chart, labs and discussed the procedure including the risks, benefits and alternatives for the proposed anesthesia with the patient or authorized representative who has indicated his/her understanding and acceptance.   Dental advisory given  Plan Discussed with: CRNA  Anesthesia Plan Comments:         Anesthesia Quick Evaluation  

## 2013-01-19 NOTE — Anesthesia Postprocedure Evaluation (Signed)
  Anesthesia Post-op Note  Patient: Donna Baker  Procedure(s) Performed: Procedure(s) with comments: RIGHT SHOULDER ARTHROSCOPY DECOMPRESSION SUBACROMIAL PARTIAL ACROMIOPLASTY WITH CORACOACROMIAL RELEASE, ARTHROSCOPY SHOULDER DISTAL CLAVICULECTOMY, ARTHROSCOPY SHOULDER WITH ROTATOR CUFF REPAIR  (Right) - RIGHT SHOULDER SCOPE, DISTAL CLAVICAL EXCISION, ACROMIOPLASTY, ROTATOR CUFF REPAIR.  ANESTHESIA: GENERAL, PRE/POST OP SCALENE  Patient Location: PACU  Anesthesia Type:GA combined with regional for post-op pain  Level of Consciousness: awake, alert  and oriented  Airway and Oxygen Therapy: Patient Spontanous Breathing  Post-op Pain: mild  Post-op Assessment: Post-op Vital signs reviewed, Patient's Cardiovascular Status Stable, Respiratory Function Stable, Patent Airway and No signs of Nausea or vomiting  Post-op Vital Signs: Reviewed and stable  Complications: No apparent anesthesia complications

## 2013-01-19 NOTE — Brief Op Note (Signed)
01/19/2013  1:15 PM  PATIENT:  Donna Baker  57 y.o. female  PRE-OPERATIVE DIAGNOSIS:  RIGHT SHOULDER IMPINGEMENT SYNDROME, DEGENERATIVE ARTHRITIS, A/C JOINT, COMPLETE RUPTURE OF ROTATOR CUFF, BICIPITAL TENOSYNOVITIS (727.61)  POST-OPERATIVE DIAGNOSIS:  right shoulder impingement syndrome degenerative arthritis acromioclavicular joint complete rupture of rotator cuff  PROCEDURE:  Procedure(s) with comments: RIGHT SHOULDER ARTHROSCOPY DECOMPRESSION SUBACROMIAL PARTIAL ACROMIOPLASTY WITH CORACOACROMIAL RELEASE, ARTHROSCOPY SHOULDER DISTAL CLAVICULECTOMY, ARTHROSCOPY SHOULDER WITH ROTATOR CUFF REPAIR  (Right) - RIGHT SHOULDER SCOPE, DISTAL CLAVICAL EXCISION, ACROMIOPLASTY, ROTATOR CUFF REPAIR.  ANESTHESIA: GENERAL, PRE/POST OP SCALENE  SURGEON:  Surgeon(s) and Role:    * Loreta Ave, MD - Primary  PHYSICIAN ASSISTANT: Zonia Kief M      ANESTHESIA:   general  EBL:  Total I/O In: 1400 [I.V.:1400] Out: -   BLOOD ADMINISTERED:none  SPECIMEN:  No Specimen  DISPOSITION OF SPECIMEN:  N/A  COUNTS:  YES  TOURNIQUET:  * No tourniquets in log *  PATIENT DISPOSITION:  PACU - hemodynamically stable.

## 2013-01-19 NOTE — Transfer of Care (Signed)
Immediate Anesthesia Transfer of Care Note  Patient: Donna Baker  Procedure(s) Performed: Procedure(s) with comments: RIGHT SHOULDER ARTHROSCOPY DECOMPRESSION SUBACROMIAL PARTIAL ACROMIOPLASTY WITH CORACOACROMIAL RELEASE, ARTHROSCOPY SHOULDER DISTAL CLAVICULECTOMY, ARTHROSCOPY SHOULDER WITH ROTATOR CUFF REPAIR  (Right) - RIGHT SHOULDER SCOPE, DISTAL CLAVICAL EXCISION, ACROMIOPLASTY, ROTATOR CUFF REPAIR.  ANESTHESIA: GENERAL, PRE/POST OP SCALENE  Patient Location: PACU  Anesthesia Type:GA combined with regional for post-op pain  Level of Consciousness: awake and alert   Airway & Oxygen Therapy: Patient Spontanous Breathing and Patient connected to face mask oxygen  Post-op Assessment: Report given to PACU RN and Post -op Vital signs reviewed and stable  Post vital signs: Reviewed and stable  Complications: No apparent anesthesia complications

## 2013-01-20 NOTE — Op Note (Signed)
NAME:  Donna Baker, Donna Baker NO.:  [id number]  MEDICAL RECORD NO.:  [id number]  LOCATION:                                 FACILITY:  PHYSICIAN:  Loreta Ave, M.D. DATE OF BIRTH:  [birth year]  DATE OF PROCEDURE:  01/19/2013 DATE OF DISCHARGE:                              OPERATIVE REPORT   PREOPERATIVE DIAGNOSES:  Right shoulder impingement, rotator cuff tear, biceps tendinopathy, distal clavicle osteolysis.  POSTOPERATIVE DIAGNOSES:  Right shoulder impingement, rotator cuff tear, biceps tendinopathy, distal clavicle osteolysis with still an intact biceps tendon.  Complex tearing, anterior labrum.  Complete full- thickness tear, entire crescent region, supraspinatus tendon.  PROCEDURE:  Right shoulder exam under anesthesia, arthroscopy. Debridement of labrum and rotator cuff with mobilization for repair. Bursectomy, acromioplasty, CA ligament release.  Excision of distal clavicle.  Arthroscopic-assisted rotator cuff repair with fiber-weave suture x2, swivel lock anchors x2.  SURGEON:  Loreta Ave, M.D.  ASSISTANT:  Genene Churn. Barry Dienes, PA-C, present throughout the entire case and necessary for timely completion of procedure.  ANESTHESIA:  General.  BLOOD LOSS:  Minimal.  SPECIMENS:  None.  CULTURES:  None.  COMPLICATIONS:  None.  DRESSINGS:  Soft compressive shoulder immobilizer.  PROCEDURE:  The patient was brought to the operating room and placed on the operating table in supine position.  After adequate anesthesia had been obtained, shoulder examined.  Full motion and stable shoulder. Placed in beach-chair position on the shoulder positioner, prepped and draped in usual sterile fashion.  Three portals; anterior, posterior and lateral.  Arthroscope was introduced, shoulder was distended and inspected.  Some complex tearing of the anterior labrum debrided. Biceps tendon with some inflammation, but really functionally intact without significant  tearing.  Articular cartilage looked good throughout.  Full-thickness tear of supraspinatus tendon throughout the crescent region.  Debrided and mobilized for repair.  Cannula was redirected subacromially.  Bursa was resected.  Acromioplasty from type 2 to type 1 acromion, releasing the CA ligament.  Grade 4 changes in distal clavicle.  Periarticular spurs lateral centimeter of clavicle resected.  Adequacy of decompression confirmed viewing from all portals. With the cannula laterally, the cuff was captured with two horizontal mattress sutures with fiber-weave suture utilizing a Scorpion device. Firmly anchored down the tuberosity with two prepunched swivel lock anchors.  At completion, nice firm watertight closure of the cuff achieved.  Instruments and fluid were removed.  Portals were closed with nylon.  Sterile compressive dressing was applied.  Anesthesia was reversed.  Brought to the recovery room.  Tolerated the surgery well. No complications.     Loreta Ave, M.D.     DFM/MEDQ  D:  01/19/2013  T:  01/20/2013  Job:  [id number]

## 2013-01-23 ENCOUNTER — Encounter (HOSPITAL_BASED_OUTPATIENT_CLINIC_OR_DEPARTMENT_OTHER): Payer: Self-pay | Admitting: Orthopedic Surgery

## 2013-04-24 ENCOUNTER — Other Ambulatory Visit: Payer: Self-pay

## 2013-04-24 DIAGNOSIS — Z1231 Encounter for screening mammogram for malignant neoplasm of breast: Secondary | ICD-10-CM

## 2013-05-25 ENCOUNTER — Ambulatory Visit: Payer: No Typology Code available for payment source

## 2013-05-25 ENCOUNTER — Ambulatory Visit
Admission: RE | Admit: 2013-05-25 | Discharge: 2013-05-25 | Disposition: A | Discharge status: Home / Self Care | Payer: No Typology Code available for payment source | Source: Ambulatory Visit

## 2013-05-25 DIAGNOSIS — Z1231 Encounter for screening mammogram for malignant neoplasm of breast: Secondary | ICD-10-CM

## 2013-06-07 ENCOUNTER — Ambulatory Visit: Payer: Self-pay | Admitting: Gynecology

## 2013-06-15 ENCOUNTER — Telehealth: Payer: Self-pay | Admitting: *Deleted

## 2013-06-15 ENCOUNTER — Encounter: Payer: Self-pay | Admitting: Gynecology

## 2013-06-15 ENCOUNTER — Ambulatory Visit (INDEPENDENT_AMBULATORY_CARE_PROVIDER_SITE_OTHER): Payer: No Typology Code available for payment source | Admitting: Gynecology

## 2013-06-15 VITALS — BP 110/70 | Ht 59.75 in | Wt 138.0 lb

## 2013-06-15 DIAGNOSIS — R600 Localized edema: Secondary | ICD-10-CM | POA: Insufficient documentation

## 2013-06-15 DIAGNOSIS — R609 Edema, unspecified: Secondary | ICD-10-CM

## 2013-06-15 DIAGNOSIS — M899 Disorder of bone, unspecified: Secondary | ICD-10-CM

## 2013-06-15 DIAGNOSIS — N951 Menopausal and female climacteric states: Secondary | ICD-10-CM

## 2013-06-15 DIAGNOSIS — Z23 Encounter for immunization: Secondary | ICD-10-CM

## 2013-06-15 DIAGNOSIS — Z79890 Hormone replacement therapy (postmenopausal): Secondary | ICD-10-CM

## 2013-06-15 DIAGNOSIS — Z01419 Encounter for gynecological examination (general) (routine) without abnormal findings: Secondary | ICD-10-CM

## 2013-06-15 DIAGNOSIS — M8580 Other specified disorders of bone density and structure, unspecified site: Secondary | ICD-10-CM | POA: Insufficient documentation

## 2013-06-15 DIAGNOSIS — M949 Disorder of cartilage, unspecified: Secondary | ICD-10-CM

## 2013-06-15 DIAGNOSIS — Z780 Asymptomatic menopausal state: Secondary | ICD-10-CM

## 2013-06-15 LAB — COMPREHENSIVE METABOLIC PANEL
ALT: 12 U/L (ref 0–35)
Alkaline Phosphatase: 45 U/L (ref 39–117)
CO2: 28 mEq/L (ref 19–32)
Potassium: 3.3 mEq/L — ABNORMAL LOW (ref 3.5–5.3)
Sodium: 136 mEq/L (ref 135–145)
Total Bilirubin: 0.5 mg/dL (ref 0.3–1.2)
Total Protein: 7 g/dL (ref 6.0–8.3)

## 2013-06-15 LAB — CBC WITH DIFFERENTIAL/PLATELET
Basophils Relative: 0 % (ref 0–1)
Hemoglobin: 13 g/dL (ref 12.0–15.0)
Lymphocytes Relative: 27 % (ref 12–46)
MCHC: 34.5 g/dL (ref 30.0–36.0)
Monocytes Relative: 6 % (ref 3–12)
Neutro Abs: 4 10*3/uL (ref 1.7–7.7)
Neutrophils Relative %: 66 % (ref 43–77)
RBC: 4.24 MIL/uL (ref 3.87–5.11)
WBC: 6.1 10*3/uL (ref 4.0–10.5)

## 2013-06-15 LAB — LIPID PANEL
LDL Cholesterol: 113 mg/dL — ABNORMAL HIGH (ref 0–99)
VLDL: 25 mg/dL (ref 0–40)

## 2013-06-15 LAB — TSH: TSH: 1.203 u[IU]/mL (ref 0.350–4.500)

## 2013-06-15 MED ORDER — TRIAMTERENE-HCTZ 75-50 MG PO TABS: 1.0000 | ORAL_TABLET | ORAL | Status: DC

## 2013-06-15 MED ORDER — ESTRADIOL 0.0375 MG/24HR TD PTTW: 1.0000 | MEDICATED_PATCH | TRANSDERMAL | Status: DC

## 2013-06-15 NOTE — Progress Notes (Signed)
Donna Baker 09-09-1956 [id number]   History:    57 y.o.  for annual gyn exam who is new patient to the practice. Patient then followed by Dr. Nicholas Lose who is retired. Patient is a gravida 3 para 2 AB 1. Her PCP has been doing her lab work in the past but she wanted to have her lab work done here in our office. The patient has a history of colon abdominal hysterectomy several years ago for symptomatic leiomyomatous uteri. Her bone density 2009 we do not have her report but she stated that they told her she had osteopenia. She is taking her calcium with vitamin D. Dr. But she had done her colonoscopy in 2013 and she is on a five-year recall because of past history of benign colon polyps. Her mammogram this year was 3-D and dense breast were noted but otherwise normal. She had been on hormone replacement therapy consisting of Vivelle-Dot 0.05 twice a week but she states that since she started in October of 2013 she is gaining weight. She is not received her shingles vaccine or Tdap. She did state that many years ago she had cryotherapy of her cervix.  Past medical history,surgical history, family history and social history were all reviewed and documented in the EPIC chart.  Gynecologic History No LMP recorded. Patient has had a hysterectomy. Contraception: post menopausal status Last Pap: 2013. Results were: normal Last mammogram: see above. Results were: see above  Obstetric History OB History   Grav Para Term Preterm Abortions TAB SAB Ect Mult Living   3 2   1  1   2      # Outc Date GA Lbr Len/2nd Wgt Sex Del Anes PTL Lv   1 PAR            2 PAR            3 SAB                ROS: A ROS was performed and pertinent positives and negatives are included in the history.  GENERAL: No fevers or chills. HEENT: No change in vision, no earache, sore throat or sinus congestion. NECK: No pain or stiffness. CARDIOVASCULAR: No chest pain or pressure. No palpitations. PULMONARY: No shortness of  breath, cough or wheeze. GASTROINTESTINAL: No abdominal pain, nausea, vomiting or diarrhea, melena or bright red blood per rectum. GENITOURINARY: No urinary frequency, urgency, hesitancy or dysuria. MUSCULOSKELETAL: No joint or muscle pain, no back pain, no recent trauma. DERMATOLOGIC: No rash, no itching, no lesions. ENDOCRINE: No polyuria, polydipsia, no heat or cold intolerance. No recent change in weight. HEMATOLOGICAL: No anemia or easy bruising or bleeding. NEUROLOGIC: No headache, seizures, numbness, tingling or weakness. PSYCHIATRIC: No depression, no loss of interest in normal activity or change in sleep pattern.     Exam: chaperone present  BP 110/70  Ht 4' 11.75" (1.518 m)  Wt 138 lb (62.596 kg)  BMI 27.16 kg/m2  Body mass index is 27.16 kg/(m^2).  General appearance : Well developed well nourished female. No acute distress HEENT: Neck supple, trachea midline, no carotid bruits, no thyroidmegaly Lungs: Clear to auscultation, no rhonchi or wheezes, or rib retractions  Heart: Regular rate and rhythm, no murmurs or gallops Breast:Examined in sitting and supine position were symmetrical in appearance, no palpable masses or tenderness,  no skin retraction, no nipple inversion, no nipple discharge, no skin discoloration, no axillary or supraclavicular lymphadenopathy Abdomen: no palpable masses or tenderness, no rebound or guarding Extremities:  no edema or skin discoloration or tenderness  Pelvic:  Bartholin, Urethra, Skene Glands: Within normal limits             Vagina: No gross lesions or discharge  Cervix:absent  Uterusabsent Adnexa  Without masses or tenderness  Anus and perineum  normal   Rectovaginal  normal sphincter tone without palpated masses or tenderness             Hemoccult card provided     Assessment/Plan:  57 y.o. female for annual exam new patient to practice which started on postmenopausal hormone replacement therapy by another provider in October 2013  consisting of Vivelle-Dot 0.05 twice a week. Because of concerns of weight gain we are going to decrease her Vivelle-Dot to 0.0375 twice a week. The risks benefits and pros and cons of oral replacement therapy were discussed. She was provided with a prescription for shingles vaccine. She was schedule a bone density study here in our office the next few weeks. She was reminded to submit to the office the Hemoccult cards for testing. She did receive a Tdap vaccine today. The following labs were drawn today: CBC, fasting lipid profile, complete metabolic panel, TSH and urinalysis. Patient does take Maxide tablet one to 2 tablets twice a week when necessary fluid retention.    Ok Edwards MD, 2:12 PM 06/15/2013

## 2013-06-15 NOTE — Telephone Encounter (Signed)
Patient takes it PRN fluid retention. That is correct

## 2013-06-15 NOTE — Patient Instructions (Addendum)
Tetanus, Diphtheria, Pertussis (Tdap) Vaccine What You Need to Know WHY GET VACCINATED? Tetanus, diphtheria and pertussis can be very serious diseases, even for adolescents and adults. Tdap vaccine can protect us from these diseases. TETANUS (Lockjaw) causes painful muscle tightening and stiffness, usually all over the body.  It can lead to tightening of muscles in the head and neck so you can't open your mouth, swallow, or sometimes even breathe. Tetanus kills about 1 out of 5 people who are infected. DIPHTHERIA can cause a thick coating to form in the back of the throat.  It can lead to breathing problems, paralysis, heart failure, and death. PERTUSSIS (Whooping Cough) causes severe coughing spells, which can cause difficulty breathing, vomiting and disturbed sleep.  It can also lead to weight loss, incontinence, and rib fractures. Up to 2 in 100 adolescents and 5 in 100 adults with pertussis are hospitalized or have complications, which could include pneumonia and death. These diseases are caused by bacteria. Diphtheria and pertussis are spread from person to person through coughing or sneezing. Tetanus enters the body through cuts, scratches, or wounds. Before vaccines, the [place] saw as many as 200,000 cases a year of diphtheria and pertussis, and hundreds of cases of tetanus. Since vaccination began, tetanus and diphtheria have dropped by about 99% and pertussis by about 80%. TDAP VACCINE Tdap vaccine can protect adolescents and adults from tetanus, diphtheria, and pertussis. One dose of Tdap is routinely given at age 11 or 12. People who did not get Tdap at that age should get it as soon as possible. Tdap is especially important for health care professionals and anyone having close contact with a baby younger than 12 months. Pregnant women should get a dose of Tdap during every pregnancy, to protect the newborn from pertussis. Infants are most at risk for severe, life-threatening  complications from pertussis. A similar vaccine, called Td, protects from tetanus and diphtheria, but not pertussis. A Td booster should be given every 10 years. Tdap may be given as one of these boosters if you have not already gotten a dose. Tdap may also be given after a severe cut or burn to prevent tetanus infection. Your doctor can give you more information. Tdap may safely be given at the same time as other vaccines. SOME PEOPLE SHOULD NOT GET THIS VACCINE  If you ever had a life-threatening allergic reaction after a dose of any tetanus, diphtheria, or pertussis containing vaccine, OR if you have a severe allergy to any part of this vaccine, you should not get Tdap. Tell your doctor if you have any severe allergies.  If you had a coma, or long or multiple seizures within 7 days after a childhood dose of DTP or DTaP, you should not get Tdap, unless a cause other than the vaccine was found. You can still get Td.  Talk to your doctor if you:  have epilepsy or another nervous system problem,  had severe pain or swelling after any vaccine containing diphtheria, tetanus or pertussis,  ever had Guillain-Barr Syndrome (GBS),  aren't feeling well on the day the shot is scheduled. RISKS OF A VACCINE REACTION With any medicine, including vaccines, there is a chance of side effects. These are usually mild and go away on their own, but serious reactions are also possible. Brief fainting spells can follow a vaccination, leading to injuries from falling. Sitting or lying down for about 15 minutes can help prevent these. Tell your doctor if you feel dizzy or light-headed, or   have vision changes or ringing in the ears. Mild problems following Tdap (Did not interfere with activities)  Pain where the shot was given (about 3 in 4 adolescents or 2 in 3 adults)  Redness or swelling where the shot was given (about 1 person in 5)  Mild fever of at least 100.55F (up to about 1 in 25 adolescents or 1 in  100 adults)  Headache (about 3 or 4 people in 10)  Tiredness (about 1 person in 3 or 4)  Nausea, vomiting, diarrhea, stomach ache (up to 1 in 4 adolescents or 1 in 10 adults)  Chills, body aches, sore joints, rash, swollen glands (uncommon) Moderate problems following Tdap (Interfered with activities, but did not require medical attention)  Pain where the shot was given (about 1 in 5 adolescents or 1 in 100 adults)  Redness or swelling where the shot was given (up to about 1 in 16 adolescents or 1 in 25 adults)  Fever over 102F (about 1 in 100 adolescents or 1 in 250 adults)  Headache (about 3 in 20 adolescents or 1 in 10 adults)  Nausea, vomiting, diarrhea, stomach ache (up to 1 or 3 people in 100)  Swelling of the entire arm where the shot was given (up to about 3 in 100). Severe problems following Tdap (Unable to perform usual activities, required medical attention)  Swelling, severe pain, bleeding and redness in the arm where the shot was given (rare). A severe allergic reaction could occur after any vaccine (estimated less than 1 in a million doses). WHAT IF THERE IS A SERIOUS REACTION? What should I look for?  Look for anything that concerns you, such as signs of a severe allergic reaction, very high fever, or behavior changes. Signs of a severe allergic reaction can include hives, swelling of the face and throat, difficulty breathing, a fast heartbeat, dizziness, and weakness. These would start a few minutes to a few hours after the vaccination. What should I do?  If you think it is a severe allergic reaction or other emergency that can't wait, call 9-1-1 or get the person to the nearest hospital. Otherwise, call your doctor.  Afterward, the reaction should be reported to the "Vaccine Adverse Event Reporting System" (VAERS). Your doctor might file this report, or you can do it yourself through the VAERS web site at www.vaers.LAgents.no, or by calling 1-740-389-5974. VAERS is  only for reporting reactions. They do not give medical advice.  THE NATIONAL VACCINE INJURY COMPENSATION PROGRAM The National Vaccine Injury Compensation Program (VICP) is a federal program that was created to compensate people who may have been injured by certain vaccines. Persons who believe they may have been injured by a vaccine can learn about the program and about filing a claim by calling 1-914 021 8535 or visiting the VICP website at SpiritualWord.at. HOW CAN I LEARN MORE?  Ask your doctor.  Call your local or state health department.  Contact the Centers for Disease Control and Prevention (CDC):  Call 737-322-4391 or visit CDC's website at PicCapture.uy. CDC Tdap Vaccine VIS (03/24/12) Document Released: 05/03/2012 Document Revised: 07/27/2012 Document Reviewed: 05/03/2012 ExitCare Patient Information 2014 [place], [place].   Shingles Vaccine What You Need to Know WHAT IS SHINGLES?  Shingles is a painful skin rash, often with blisters. It is also called Herpes Zoster or just Zoster.  A shingles rash usually appears on one side of the face or body and lasts from 2 to 4 weeks. Its main symptom is pain, which can be quite  severe. Other symptoms of shingles can include fever, headache, chills, and upset stomach. Very rarely, a shingles infection can lead to pneumonia, hearing problems, blindness, brain inflammation (encephalitis), or death.  For about 1 person in 5, severe pain can continue even after the rash clears up. This is called post-herpetic neuralgia.  Shingles is caused by the Varicella Zoster virus. This is the same virus that causes chickenpox. Only someone who has had a case of chickenpox or rarely, has gotten chickenpox vaccine, can get shingles. The virus stays in your body. It can reappear many years later to cause a case of shingles.  You cannot catch shingles from another person with shingles. However, a person who has never had chickenpox  (or chickenpox vaccine) could get chickenpox from someone with shingles. This is not very common.  Shingles is far more common in people 61 and older than in younger people. It is also more common in people whose immune systems are weakened because of a disease such as cancer or drugs such as steroids or chemotherapy.  At least 1 million people get shingles per year in the [place]. SHINGLES VACCINE  A vaccine for shingles was licensed in 2006. In clinical trials, the vaccine reduced the risk of shingles by 50%. It can also reduce the pain in people who still get shingles after being vaccinated.  A single dose of shingles vaccine is recommended for adults 4 years of age and older. SOME PEOPLE SHOULD NOT GET SHINGLES VACCINE OR SHOULD WAIT A person should not get shingles vaccine if he or she:  Has ever had a life-threatening allergic reaction to gelatin, the antibiotic neomycin, or any other component of shingles vaccine. Tell your caregiver if you have any severe allergies.  Has a weakened immune system because of current:  AIDS or another disease that affects the immune system.  Treatment with drugs that affect the immune system, such as prolonged use of high-dose steroids.  Cancer treatment, such as radiation or chemotherapy.  Cancer affecting the bone marrow or lymphatic system, such as leukemia or lymphoma.  Is pregnant, or might be pregnant. Women should not become pregnant until at least 4 weeks after getting shingles vaccine. Someone with a minor illness, such as a cold, may be vaccinated. Anyone with a moderate or severe acute illness should usually wait until he or she recovers before getting the vaccine. This includes anyone with a temperature of 101.3 F (38 C) or higher. WHAT ARE THE RISKS FROM SHINGLES VACCINE?  A vaccine, like any medicine, could possibly cause serious problems, such as severe allergic reactions. However, the risk of a vaccine causing serious harm,  or death, is extremely small.  No serious problems have been identified with shingles vaccine. Mild Problems  Redness, soreness, swelling, or itching at the site of the injection (about 1 person in 3).  Headache (about 1 person in 70). Like all vaccines, shingles vaccine is being closely monitored for unusual or severe problems. WHAT IF THERE IS A MODERATE OR SEVERE REACTION? What should I look for? Any unusual condition, such as a severe allergic reaction or a high fever. If a severe allergic reaction occurred, it would be within a few minutes to an hour after the shot. Signs of a serious allergic reaction can include difficulty breathing, weakness, hoarseness or wheezing, a fast heartbeat, hives, dizziness, paleness, or swelling of the throat. What should I do?  Call your caregiver, or get the person to a caregiver right away.  Tell  the caregiver what happened, the date and time it happened, and when the vaccination was given.  Ask the caregiver to report the reaction by filing a Vaccine Adverse Event Reporting System (VAERS) form. Or, you can file this report through the VAERS web site at www.vaers.LAgents.no or by calling 1-(971) 221-8399. VAERS does not provide medical advice. HOW CAN I LEARN MORE?  Ask your caregiver. He or she can give you the vaccine package insert or suggest other sources of information.  Contact the Centers for Disease Control and Prevention (CDC):  Call 775-247-0168 (1-800-CDC-INFO).  Visit the CDC website at PicCapture.uy CDC Shingles Vaccine VIS (08/21/08) Document Released: 08/30/2006 Document Revised: 01/25/2012 Document Reviewed: 08/21/2008 ExitCare Patient Information 2014 [place], [place].  Transvaginal Ultrasound Transvaginal ultrasound is a pelvic ultrasound, using a metal probe that is placed in the vagina, to look at a women's female organs. Transvaginal ultrasound is a method of seeing inside the pelvis of a woman. The ultrasound machine  sends out sound waves from the transducer (probe). These sound waves bounce off body structures (like an echo) to create a picture. The picture shows up on a monitor. It is called transvaginal because the probe is inserted into the vagina. There should be very little discomfort from the vaginal probe. This test can also be used during pregnancy. Endovaginal ultrasound is another name for a transvaginal ultrasound. In a transabdominal ultrasound, the probe is placed on the outside of the belly. This method gives pictures that are lower quality than pictures from the transvaginal technique. Transvaginal ultrasound is used to look for problems of the female genital tract. Some such problems include:  Infertility problems.  Congenital (birth defect) malformations of the uterus and ovaries.  Tumors in the uterus.  Abnormal bleeding.  Ovarian tumors and cysts.  Abscess (inflamed tissue around pus) in the pelvis.  Unexplained abdominal or pelvic pain.  Pelvic infection. DURING PREGNANCY, TRANSVAGINAL ULTRASOUND MAY BE USED TO LOOK AT:  Normal pregnancy.  Ectopic pregnancy (pregnancy outside the uterus).  Fetal heartbeat.  Abnormalities in the pelvis, that are not seen well with transabdominal ultrasound.  Suspected twins or multiples.  Impending miscarriage.  Problems with the cervix (incompetent cervix, not able to stay closed and hold the baby).  When doing an amniocentesis (removing fluid from the pregnancy sac, for testing).  Looking for abnormalities of the baby.  Checking the growth, development, and age of the fetus.  Measuring the amount of fluid in the amniotic sac.  When doing an external version of the baby (moving baby into correct position).  Evaluating the baby for problems in high risk pregnancies (biophysical profile).  Suspected fetal demise (death). Sometimes a special ultrasound method called Saline Infusion Sonography (SIS) is used for a more accurate look  at the uterus. Sterile saline (salt water) is injected into the uterus of non-pregnant patients to see the inside of the uterus better. SIS is not used on pregnant women. The vaginal probe can also assist in obtaining biopsies of abnormal areas, in draining fluid from cysts on the ovary, and in finding IUDs (intrauterine device, birth control) that cannot be located. PREPARATION FOR TEST A transvaginal ultrasound is done with the bladder empty. The transabdominal ultrasound is done with your bladder full. You may be asked to drink several glasses of water before that exam. Sometimes, a transabdominal ultrasound is done just after a transvaginal ultrasound, to look at organs in your abdomen. PROCEDURE  You will lie down on a table, with your knees bent and  your feet in foot holders. The probe is covered with a condom. A sterile lubricant is put into the vagina and on the probe. The lubricant helps transmit the sound waves and avoid irritating the vagina. Your caregiver will move the probe inside the vaginal cavity to scan the pelvic structures. A normal test will show a normal pelvis and normal contents. An abnormal test will show abnormalities of the pelvis, placenta, or baby. ABNORMAL RESULTS MAY BE DUE TO:  Growths or tumors in the:  Uterus.  Ovaries.  Vagina.  Other pelvic structures.  Non-cancerous growths of the uterus and ovaries.  Twisting of the ovary, cutting off blood supply to the ovary (ovarian torsion).  Areas of infection, including:  Pelvic inflammatory disease.  Abscess in the pelvis.  Locating an IUD. PROBLEMS FOUND IN PREGNANT WOMEN MAY INCLUDE:  Ectopic pregnancy (pregnancy outside the uterus).  Multiple pregnancies.  Early dilation (opening) of the cervix. This may indicate an incompetent cervix and early delivery.  Impending miscarriage.  Fetal death.  Problems with the placenta, including:  Placenta has grown over the opening of the womb (placenta  previa).  Placenta has separated early in the womb (placental abruption).  Placenta grows into the muscle of the uterus (placenta accreta).  Tumors of pregnancy, including gestational trophoblastic disease. This is an abnormal pregnancy, with no fetus. The uterus is filled with many grape-like cysts that could sometimes be cancerous.  Incorrect position of the fetus (breech, vertex).  Intrauterine fetal growth retardation (IUGR) (poor growth in the womb).  Fetal abnormalities or infection. RISKS AND COMPLICATIONS There are no known risks to the ultrasound procedure. There is no X-ray used when doing an ultrasound. Document Released: 10/14/2004 Document Revised: 01/25/2012 Document Reviewed: 10/02/2009 [facility] Patient Information 2014 [place], [place].

## 2013-06-15 NOTE — Addendum Note (Signed)
Addended by: Bertram Savin A on: 06/15/2013 02:50 PM   Modules accepted: Orders

## 2013-06-15 NOTE — Telephone Encounter (Signed)
Pharmacy called regarding direction for Maxzide 75-50 mg 1 tablet by mouth once a week # 30 that was e-scribed today . pharmacist would like to know if directions are correct? Please advise

## 2013-06-15 NOTE — Telephone Encounter (Signed)
[facility] pharmacist informed with the below note.

## 2013-06-16 LAB — URINALYSIS W MICROSCOPIC + REFLEX CULTURE
Crystals: NONE SEEN
Leukocytes, UA: NEGATIVE
Nitrite: NEGATIVE
Specific Gravity, Urine: 1.022 (ref 1.005–1.030)
Urobilinogen, UA: 0.2 mg/dL (ref 0.0–1.0)

## 2013-06-19 ENCOUNTER — Other Ambulatory Visit: Payer: Self-pay | Admitting: Gynecology

## 2013-06-19 ENCOUNTER — Telehealth: Payer: Self-pay

## 2013-06-19 LAB — URINE CULTURE

## 2013-06-19 MED ORDER — NITROFURANTOIN MONOHYD MACRO 100 MG PO CAPS: 100.0000 mg | ORAL_CAPSULE | Freq: Two times a day (BID) | ORAL | Status: DC

## 2013-06-19 NOTE — Telephone Encounter (Signed)
Left answer in voice mail cell phone.

## 2013-06-19 NOTE — Telephone Encounter (Signed)
She said she needs to lose some weight and had talked with you at office visit regarding phenteramine (Sp? Patient was not sure of name of medication and I am not either.)  Patient said it is weight loss pill and she thought it might help jump start her losing weight.

## 2013-06-19 NOTE — Telephone Encounter (Signed)
Please informed patient that I do not prescribe weight reduction medication

## 2013-06-28 ENCOUNTER — Encounter: Payer: Self-pay | Admitting: Gynecology

## 2013-09-26 ENCOUNTER — Encounter: Payer: Self-pay | Admitting: Women's Health

## 2013-09-26 ENCOUNTER — Ambulatory Visit (INDEPENDENT_AMBULATORY_CARE_PROVIDER_SITE_OTHER): Payer: No Typology Code available for payment source | Admitting: Women's Health

## 2013-09-26 DIAGNOSIS — R300 Dysuria: Secondary | ICD-10-CM | POA: Diagnosis not present

## 2013-09-26 LAB — URINALYSIS W MICROSCOPIC + REFLEX CULTURE
Casts: NONE SEEN
Crystals: NONE SEEN
Leukocytes, UA: NEGATIVE
Nitrite: NEGATIVE
Specific Gravity, Urine: <1.005 — ABNORMAL LOW (ref 1.005–1.030)
pH: 5.5 (ref 5.0–8.0)

## 2013-09-26 MED ORDER — NITROFURANTOIN MONOHYD MACRO 100 MG PO CAPS: 100.0000 mg | ORAL_CAPSULE | Freq: Two times a day (BID) | ORAL | Status: DC

## 2013-09-26 NOTE — Progress Notes (Signed)
Patient ID: Donna Baker, female   DOB: 1956/01/21, 57 y.o.   MRN: [id number] Presents with questionable UTI. States at annual exam 05/2013 UTI with less symptoms noted on culture/ UA few bacteria. States has low-back pain, denies abdominal pain, fever, frequency or urgency. Minimal discomfort with urination. Denies vaginal discharge. TAH for fibroids  on [place].  Exam: Appears well. UA trace blood, 0 did 2 RBCs, 0 did 2 WBCs, bacteria few.  Possible UTI  Plan: Reviewed waiting for culture results. Travels with  job and would prefer to get started on antibiotic. Macrobid twice daily for 7 days, take with food. If culture results negative will stop antibiotic. UTI prevention discussed.

## 2013-09-26 NOTE — Patient Instructions (Signed)
Urinary Tract Infection  Urinary tract infections (UTIs) can develop anywhere along your urinary tract. Your urinary tract is your body's drainage system for removing wastes and extra water. Your urinary tract includes two kidneys, two ureters, a bladder, and a urethra. Your kidneys are a pair of bean-shaped organs. Each kidney is about the size of your fist. They are located below your ribs, one on each side of your spine.  CAUSES  Infections are caused by microbes, which are microscopic organisms, including fungi, viruses, and bacteria. These organisms are so small that they can only be seen through a microscope. Bacteria are the microbes that most commonly cause UTIs.  SYMPTOMS   Symptoms of UTIs may vary by age and gender of the patient and by the location of the infection. Symptoms in young women typically include a frequent and intense urge to urinate and a painful, burning feeling in the bladder or urethra during urination. Older women and men are more likely to be tired, shaky, and weak and have muscle aches and abdominal pain. A fever may mean the infection is in your kidneys. Other symptoms of a kidney infection include pain in your back or sides below the ribs, nausea, and vomiting.  DIAGNOSIS  To diagnose a UTI, your caregiver will ask you about your symptoms. Your caregiver also will ask to provide a urine sample. The urine sample will be tested for bacteria and white blood cells. White blood cells are made by your body to help fight infection.  TREATMENT   Typically, UTIs can be treated with medication. Because most UTIs are caused by a bacterial infection, they usually can be treated with the use of antibiotics. The choice of antibiotic and length of treatment depend on your symptoms and the type of bacteria causing your infection.  HOME CARE INSTRUCTIONS   If you were prescribed antibiotics, take them exactly as your caregiver instructs you. Finish the medication even if you feel better after you  have only taken some of the medication.   Drink enough water and fluids to keep your urine clear or pale yellow.   Avoid caffeine, tea, and carbonated beverages. They tend to irritate your bladder.   Empty your bladder often. Avoid holding urine for long periods of time.   Empty your bladder before and after sexual intercourse.   After a bowel movement, women should cleanse from front to back. Use each tissue only once.  SEEK MEDICAL CARE IF:    You have back pain.   You develop a fever.   Your symptoms do not begin to resolve within 3 days.  SEEK IMMEDIATE MEDICAL CARE IF:    You have severe back pain or lower abdominal pain.   You develop chills.   You have nausea or vomiting.   You have continued burning or discomfort with urination.  MAKE SURE YOU:    Understand these instructions.   Will watch your condition.   Will get help right away if you are not doing well or get worse.  Document Released: 08/12/2005 Document Revised: 05/03/2012 Document Reviewed: 12/11/2011  Exit[employer] Patient Information 2014 [employer].

## 2013-09-27 LAB — URINE CULTURE: Organism ID, Bacteria: NO GROWTH

## 2013-09-28 ENCOUNTER — Encounter: Payer: Self-pay | Admitting: Women's Health

## 2013-09-28 ENCOUNTER — Telehealth: Payer: Self-pay | Admitting: Women's Health

## 2013-09-28 NOTE — Telephone Encounter (Signed)
aa

## 2013-10-19 ENCOUNTER — Other Ambulatory Visit: Payer: Self-pay | Admitting: Gynecology

## 2013-10-19 ENCOUNTER — Telehealth: Payer: Self-pay

## 2013-10-19 MED ORDER — TRIAMTERENE-HCTZ 75-50 MG PO TABS: ORAL_TABLET | ORAL | Status: DC

## 2013-10-19 NOTE — Telephone Encounter (Signed)
Rx sent to pharmacy with new instructions.

## 2013-10-19 NOTE — Telephone Encounter (Signed)
Former patient of Dr. Nicholas Lose and saw you in July as new patient. You prescribed her Maxide for her and wrote for #30 however, directions read "take one weekly" and the insurance co. Will only allow her #4 per month.  She said when you asked her how she took them she told you that sometimes she can take once weekly, but sometimes when traveling will need several in a week and sometimes none.  She said she would like to be able to get the #30 so she can take them as she needs them.  Ok if I re-escribe them and use the directions as "Take one daily as needed" so that insurance will allow her to get #30?

## 2013-10-19 NOTE — Telephone Encounter (Signed)
Yes that it sounds fine thank you

## 2014-05-24 ENCOUNTER — Other Ambulatory Visit: Payer: Self-pay

## 2014-05-24 DIAGNOSIS — Z1231 Encounter for screening mammogram for malignant neoplasm of breast: Secondary | ICD-10-CM

## 2014-06-15 ENCOUNTER — Ambulatory Visit: Payer: No Typology Code available for payment source

## 2014-06-17 ENCOUNTER — Other Ambulatory Visit: Payer: Self-pay | Admitting: Gynecology

## 2014-06-22 ENCOUNTER — Encounter: Payer: No Typology Code available for payment source | Admitting: Gynecology

## 2014-07-10 ENCOUNTER — Ambulatory Visit
Admission: RE | Admit: 2014-07-10 | Discharge: 2014-07-10 | Disposition: A | Discharge status: Home / Self Care | Payer: PRIVATE HEALTH INSURANCE | Source: Ambulatory Visit

## 2014-07-10 DIAGNOSIS — Z1231 Encounter for screening mammogram for malignant neoplasm of breast: Secondary | ICD-10-CM

## 2014-07-20 ENCOUNTER — Encounter: Payer: No Typology Code available for payment source | Admitting: Gynecology

## 2014-08-09 ENCOUNTER — Encounter: Payer: Self-pay | Admitting: Gynecology

## 2014-08-09 ENCOUNTER — Ambulatory Visit (INDEPENDENT_AMBULATORY_CARE_PROVIDER_SITE_OTHER): Payer: No Typology Code available for payment source | Admitting: Gynecology

## 2014-08-09 ENCOUNTER — Telehealth: Payer: Self-pay | Admitting: *Deleted

## 2014-08-09 VITALS — BP 106/68 | Ht 59.75 in | Wt 146.7 lb

## 2014-08-09 DIAGNOSIS — R635 Abnormal weight gain: Secondary | ICD-10-CM | POA: Diagnosis not present

## 2014-08-09 DIAGNOSIS — M899 Disorder of bone, unspecified: Secondary | ICD-10-CM

## 2014-08-09 DIAGNOSIS — Z01419 Encounter for gynecological examination (general) (routine) without abnormal findings: Secondary | ICD-10-CM

## 2014-08-09 DIAGNOSIS — M8580 Other specified disorders of bone density and structure, unspecified site: Secondary | ICD-10-CM

## 2014-08-09 DIAGNOSIS — Z79890 Hormone replacement therapy (postmenopausal): Secondary | ICD-10-CM

## 2014-08-09 DIAGNOSIS — Z1159 Encounter for screening for other viral diseases: Secondary | ICD-10-CM

## 2014-08-09 DIAGNOSIS — M949 Disorder of cartilage, unspecified: Secondary | ICD-10-CM | POA: Diagnosis not present

## 2014-08-09 LAB — COMPREHENSIVE METABOLIC PANEL
ALT: 20 U/L (ref 0–35)
AST: 19 U/L (ref 0–37)
Albumin: 4.5 g/dL (ref 3.5–5.2)
Alkaline Phosphatase: 47 U/L (ref 39–117)
BILIRUBIN TOTAL: 0.6 mg/dL (ref 0.2–1.2)
BUN: 16 mg/dL (ref 6–23)
CO2: 27 mEq/L (ref 19–32)
CREATININE: 0.75 mg/dL (ref 0.50–1.10)
Calcium: 9.9 mg/dL (ref 8.4–10.5)
Chloride: 99 mEq/L (ref 96–112)
Glucose, Bld: 89 mg/dL (ref 70–99)
Potassium: 3.5 mEq/L (ref 3.5–5.3)
SODIUM: 137 meq/L (ref 135–145)
TOTAL PROTEIN: 7.5 g/dL (ref 6.0–8.3)

## 2014-08-09 LAB — LIPID PANEL
CHOL/HDL RATIO: 3.8 ratio
Cholesterol: 230 mg/dL — ABNORMAL HIGH (ref 0–200)
HDL: 61 mg/dL (ref >39)
LDL CALC: 137 mg/dL — AB (ref 0–99)
Triglycerides: 161 mg/dL — ABNORMAL HIGH (ref <150)
VLDL: 32 mg/dL (ref 0–40)

## 2014-08-09 LAB — CBC WITH DIFFERENTIAL/PLATELET
BASOS ABS: 0.1 10*3/uL (ref 0.0–0.1)
Basophils Relative: 1 % (ref 0–1)
EOS ABS: 0.1 10*3/uL (ref 0.0–0.7)
EOS PCT: 2 % (ref 0–5)
HCT: 38.7 % (ref 36.0–46.0)
Hemoglobin: 13.2 g/dL (ref 12.0–15.0)
LYMPHS PCT: 33 % (ref 12–46)
Lymphs Abs: 1.9 10*3/uL (ref 0.7–4.0)
MCH: 30.8 pg (ref 26.0–34.0)
MCHC: 34.1 g/dL (ref 30.0–36.0)
MCV: 90.4 fL (ref 78.0–100.0)
Monocytes Absolute: 0.3 10*3/uL (ref 0.1–1.0)
Monocytes Relative: 6 % (ref 3–12)
NEUTROS PCT: 58 % (ref 43–77)
Neutro Abs: 3.4 10*3/uL (ref 1.7–7.7)
PLATELETS: 364 10*3/uL (ref 150–400)
RBC: 4.28 MIL/uL (ref 3.87–5.11)
RDW: 14.2 % (ref 11.5–15.5)
WBC: 5.8 10*3/uL (ref 4.0–10.5)

## 2014-08-09 LAB — TSH: TSH: 1.97 u[IU]/mL (ref 0.350–4.500)

## 2014-08-09 MED ORDER — ESTRADIOL 0.0375 MG/24HR TD PTTW: 1.0000 | MEDICATED_PATCH | TRANSDERMAL | Status: DC

## 2014-08-09 MED ORDER — PHENTERMINE HCL 37.5 MG PO CAPS: 37.5000 mg | ORAL_CAPSULE | ORAL | Status: DC

## 2014-08-09 MED ORDER — TRIAMTERENE-HCTZ 75-50 MG PO TABS: ORAL_TABLET | ORAL | Status: DC

## 2014-08-09 NOTE — Telephone Encounter (Signed)
Pt rx from [facility] phentermine 37.5 mg was not at pharmacy because medication must be called in. I called Rx in and informed pt as well

## 2014-08-09 NOTE — Progress Notes (Signed)
Donna Baker December 07, 1955 [id number]   History:    59 y.o.  for annual gyn exam was seen in 2014 as a new patient to the practice. Patient had previously been followed for many years by Dr. Nicholas Lose who is retired. Patient is a gravida 3 para 2 AB 1. Her PCP has been doing her lab work in the past but she wanted to have her lab work done here in our office. 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 She has not had a bone density since 2009 she stated that she had been informed that she had osteopenia. Patient with past history of colon polyps. Her last colonoscopy was 3 years ago and she is on a five-year recall cycle. Patient with history of dense breast had a normal 3 the mammogram this year. Patient denies any past history of abnormal Pap smears before her hysterectomy. Patient was started on transdermal estrogen in 2012 she is currently on 0.0375 Vivelle-Dot which she applied twice a week. She has been concern over weight gain since she exercises 4 times a week and watching her diet. She was weighing 138 pounds last year and is up to 146 pounds.   Past medical history,surgical history, family history and social history were all reviewed and documented in the EPIC chart.  Gynecologic History No LMP recorded. Patient has had a hysterectomy. Contraception: status post hysterectomy Last Pap: 2013. Results were: normal Last mammogram: 2015. Results were: Normal but dense had 3-dimensional mammogram this year  Obstetric History OB History  Gravida Para Term Preterm AB SAB TAB Ectopic Multiple Living  # Outcome Date GA Lbr Len/2nd Weight Sex Delivery Anes PTL Lv  3 SAB           2 PAR           1 PAR                ROS: A ROS was performed and pertinent positives and negatives are included in the history.  GENERAL: No fevers or chills. HEENT: No change in vision, no earache, sore  throat or sinus congestion. NECK: No pain or stiffness. CARDIOVASCULAR: No chest pain or pressure. No palpitations. PULMONARY: No shortness of breath, cough or wheeze. GASTROINTESTINAL: No abdominal pain, nausea, vomiting or diarrhea, melena or bright red blood per rectum. GENITOURINARY: No urinary frequency, urgency, hesitancy or dysuria. MUSCULOSKELETAL: No joint or muscle pain, no back pain, no recent trauma. DERMATOLOGIC: No rash, no itching, no lesions. ENDOCRINE: No polyuria, polydipsia, no heat or cold intolerance. No recent change in weight. HEMATOLOGICAL: No anemia or easy bruising or bleeding. NEUROLOGIC: No headache, seizures, numbness, tingling or weakness. PSYCHIATRIC: No depression, no loss of interest in normal activity or change in sleep pattern.     Exam: chaperone present  BP 106/68  Ht 4' 11.75" (1.518 m)  Wt 146 lb 11.2 oz (66.543 kg)  BMI 28.88 kg/m2  Body mass index is 28.88 kg/(m^2).  General appearance : Well developed well nourished female. No acute distress HEENT: Neck supple, trachea midline, no carotid bruits, no thyroidmegaly Lungs: Clear to auscultation, no rhonchi or wheezes, or rib retractions  Heart: Regular rate and rhythm, no murmurs or gallops Breast:Examined in sitting and supine position were symmetrical in appearance, no palpable masses or tenderness,  no skin retraction, no nipple inversion, no nipple discharge, no  skin discoloration, no axillary or supraclavicular lymphadenopathy Abdomen: no palpable masses or tenderness, no rebound or guarding Extremities: no edema or skin discoloration or tenderness  Pelvic:  Bartholin, Urethra, Skene Glands: Within normal limits             Vagina: No gross lesions or discharge  Cervix: Absent  Uterus  absent  Adnexa  Without masses or tenderness  Anus and perineum  normal   Rectovaginal  normal sphincter tone without palpated masses or tenderness             Hemoccult cards provided     Assessment/Plan:   58 y.o. female for annual exam with concerns of increasingly despite exercise and diet. We discussed a 3 month treatment with phentermine 37.5 mg 1 by mouth daily at breakfast. She will need to come to the office monthly for pulmonary and cardiac auscultation as well as weight measurements. The following risks were discussed: Palpitations, tachycardia, blood pressure elevation, headache, risk of cardiac ischemia and pulmonary hypertension. Written information was also provided. Patient will schedule her bone density for one month from now. We discussed importance of monthly breast exams. We discussed importance of calcium vitamin D and regular exercise for osteoporosis prevention. She returns back in one month she would like have her flu vaccine then. The following labs were ordered today: CBC, fasting lipid profile, comprehensive metabolic panel, TSH, and urinalysis. She was provided with fecal Hemoccult cards to submit to the office for testing. Pap smear guidelines discussed. No Pap smear done today.  New CDC guidelines is recommending patients be tested once in her lifetime for hepatitis C antibody who were born between [birth year] through 1966. This was discussed with the patient today and has agreed to be tested today.    Note: This dictation was prepared with  Dragon/digital dictation along withSmart phrase technology. Any transcriptional errors that result from this process are unintentional.   Ok Edwards MD, 10:54 AM 08/09/2014

## 2014-08-09 NOTE — Patient Instructions (Addendum)
Patient information: High cholesterol (The Basics)  What is cholesterol? - Cholesterol is a substance that is found in the blood. Everyone has some. It is needed for good health. The problem is, people sometimes have too much cholesterol. Compared with people with normal cholesterol, people with high cholesterol have a higher risk of heart attacks, strokes, and other health problems. The higher your cholesterol, the higher your risk of these problems. Cholesterol levels in your body are determined significantly by your diet. Cholesterol levels may also be related to heart disease. The following material helps to explain this relationship and discusses what you can do to help keep your heart healthy. Not all cholesterol is bad. Low-density lipoprotein (LDL) cholesterol is the "bad" cholesterol. It may cause fatty deposits to build up inside your arteries. High-density lipoprotein (HDL) cholesterol is "good." It helps to remove the "bad" LDL cholesterol from your blood. Cholesterol is a very important risk factor for heart disease. Other risk factors are high blood pressure, smoking, stress, heredity, and weight.  The heart muscle gets its supply of blood through the coronary arteries. If your LDL cholesterol is high and your HDL cholesterol is low, you are at risk for having fatty deposits build up in your coronary arteries. This leaves less room through which blood can flow. Without sufficient blood and oxygen, the heart muscle cannot function properly and you may feel chest pains (angina pectoris). When a coronary artery closes up entirely, a part of the heart muscle may die, causing a heart attack (myocardial infarction).  CHECKING CHOLESTEROL When your caregiver sends your blood to a lab to be analyzed for cholesterol, a complete lipid (fat) profile may be done. With this test, the total amount of cholesterol and levels of LDL and HDL are determined. Triglycerides  are a type of fat that circulates in the blood and can also be used to determine heart disease risk. Are there different types of cholesterol? - Yes, there are a few different types. If you get a cholesterol test, you may hear your doctor or nurse talk about: Total cholesterol  LDL cholesterol - Some people call this the "bad" cholesterol. That's because having high LDL levels raises your risk of heart attacks, strokes, and other health problems.  HDL cholesterol - Some people call this the "good" cholesterol. That's because having high HDL levels lowers your risk of heart attacks, strokes, and other health problems.  Non-HDL cholesterol - Non-HDL cholesterol is your total cholesterol minus your HDL cholesterol.  Triglycerides - Triglycerides are not cholesterol. They are a type of fat. But they often get measured when cholesterol is measured. (Having high triglycerides also seems to increase the risk of heart attacks and strokes.)   Keep in mind, though, that many people who cannot meet these goals still have a low risk of heart attacks and strokes. What should I do if my doctor tells me I have high cholesterol? - Ask your doctor what your overall risk of heart attacks and strokes is. High cholesterol, by itself, is not always a reason to worry. Having high cholesterol is just one of many things that can increase your risk of heart attacks and strokes. Other factors that increase your risk include:  Cigarette smoking  High blood pressure  Having a parent, sister, or brother who got heart disease at a young age (Young, in this case, means younger than 55 for men and younger than 65 for women.)  Being a man (Women are at risk, too, but men   have a higher risk.)  Older age  If you are at high risk of heart attacks and strokes, having high cholesterol is a problem. On the other hand, if you have are at low risk, having high cholesterol may not mean much. Should I take medicine to lower cholesterol? - Not  everyone who has high cholesterol needs medicines. Your doctor or nurse will decide if you need them based on your age, family history, and other health concerns.  You should probably take a cholesterol-lowering medicine called a statin if you: Already had a heart attack or stroke  Have known heart disease  Have diabetes  Have a condition called peripheral artery disease, which makes it painful to walk, and happens when the arteries in your legs get clogged with fatty deposits  Have an abdominal aortic aneurysm, which is a widening of the main artery in the belly  Most people with any of the conditions listed above should take a statin no matter what their cholesterol level is. If your doctor or nurse puts you on a statin, stay on it. The medicine may not make you feel any different. But it can help prevent heart attacks, strokes, and death.  Can I lower my cholesterol without medicines? - Yes, you can lower your cholesterol some by:  Avoiding red meat, butter, fried foods, cheese, and other foods that have a lot of saturated fat  Losing weight (if you are overweight)  Being more active Even if these steps do little to change your cholesterol, they can improve your health in many ways.                                                   Cholesterol Control Diet  CONTROLLING CHOLESTEROL WITH DIET Although exercise and lifestyle factors are important, your diet is key. That is because certain foods are known to raise cholesterol and others to lower it. The goal is to balance foods for their effect on cholesterol and more importantly, to replace saturated and trans fat with other types of fat, such as monounsaturated fat, polyunsaturated fat, and omega-3 fatty acids. On average, a person should consume no more than 15 to 17 g of saturated fat daily. Saturated and trans fats are considered "bad" fats, and they will raise LDL cholesterol. Saturated fats are primarily found in animal products such as  meats, butter, and cream. However, that does not mean you need to sacrifice all your favorite foods. Today, there are good tasting, low-fat, low-cholesterol substitutes for most of the things you like to eat. Choose low-fat or nonfat alternatives. Choose round or loin cuts of red meat, since these types of cuts are lowest in fat and cholesterol. Chicken (without the skin), fish, veal, and ground turkey breast are excellent choices. Eliminate fatty meats, such as hot dogs and salami. Even shellfish have little or no saturated fat. Have a 3 oz (85 g) portion when you eat lean meat, poultry, or fish. Trans fats are also called "partially hydrogenated oils." They are oils that have been scientifically manipulated so that they are solid at room temperature resulting in a longer shelf life and improved taste and texture of foods in which they are added. Trans fats are found in stick margarine, some tub margarines, cookies, crackers, and baked goods.  When baking and cooking, oils are an excellent substitute   for butter. The monounsaturated oils are especially beneficial since it is believed they lower LDL and raise HDL. The oils you should avoid entirely are saturated tropical oils, such as coconut and palm.  Remember to eat liberally from food groups that are naturally free of saturated and trans fat, including fish, fruit, vegetables, beans, grains (barley, rice, couscous, bulgur wheat), and pasta (without cream sauces).  IDENTIFYING FOODS THAT LOWER CHOLESTEROL  Soluble fiber may lower your cholesterol. This type of fiber is found in fruits such as apples, vegetables such as broccoli, potatoes, and carrots, legumes such as beans, peas, and lentils, and grains such as barley. Foods fortified with plant sterols (phytosterol) may also lower cholesterol. You should eat at least 2 g per day of these foods for a cholesterol lowering effect.  Read package labels to identify low-saturated fats, trans fats free, and  low-fat foods at the supermarket. Select cheeses that have only 2 to 3 g saturated fat per ounce. Use a heart-healthy tub margarine that is free of trans fats or partially hydrogenated oil. When buying baked goods (cookies, crackers), avoid partially hydrogenated oils. Breads and muffins should be made from whole grains (whole-wheat or whole oat flour, instead of "flour" or "enriched flour"). Buy non-creamy canned soups with reduced salt and no added fats.  FOOD PREPARATION TECHNIQUES  Never deep-fry. If you must fry, either stir-fry, which uses very little fat, or use non-stick cooking sprays. When possible, broil, bake, or roast meats, and steam vegetables. Instead of dressing vegetables with butter or margarine, use lemon and herbs, applesauce and cinnamon (for squash and sweet potatoes), nonfat yogurt, salsa, and low-fat dressings for salads.  LOW-SATURATED FAT / LOW-FAT FOOD SUBSTITUTES Meats / Saturated Fat (g)  Avoid: Steak, marbled (3 oz/85 g) / 11 g   Choose: Steak, lean (3 oz/85 g) / 4 g   Avoid: Hamburger (3 oz/85 g) / 7 g   Choose: Hamburger, lean (3 oz/85 g) / 5 g   Avoid: Ham (3 oz/85 g) / 6 g   Choose: Ham, lean cut (3 oz/85 g) / 2.4 g   Avoid: Chicken, with skin, dark meat (3 oz/85 g) / 4 g   Choose: Chicken, skin removed, dark meat (3 oz/85 g) / 2 g   Avoid: Chicken, with skin, light meat (3 oz/85 g) / 2.5 g   Choose: Chicken, skin removed, light meat (3 oz/85 g) / 1 g  Dairy / Saturated Fat (g)  Avoid: Whole milk (1 cup) / 5 g   Choose: Low-fat milk, 2% (1 cup) / 3 g   Choose: Low-fat milk, 1% (1 cup) / 1.5 g   Choose: Skim milk (1 cup) / 0.3 g   Avoid: Hard cheese (1 oz/28 g) / 6 g   Choose: Skim milk cheese (1 oz/28 g) / 2 to 3 g   Avoid: Cottage cheese, 4% fat (1 cup) / 6.5 g   Choose: Low-fat cottage cheese, 1% fat (1 cup) / 1.5 g   Avoid: Ice cream (1 cup) / 9 g   Choose: Sherbet (1 cup) / 2.5 g   Choose: Nonfat frozen yogurt (1 cup) / 0.3 g    Choose: Frozen fruit bar / trace   Avoid: Whipped cream (1 tbs) / 3.5 g   Choose: Nondairy whipped topping (1 tbs) / 1 g  Condiments / Saturated Fat (g)  Avoid: Mayonnaise (1 tbs) / 2 g   Choose: Low-fat mayonnaise (1 tbs) / 1 g   Avoid:   Butter (1 tbs) / 7 g   Choose: Extra light margarine (1 tbs) / 1 g   Avoid: Coconut oil (1 tbs) / 11.8 g   Choose: Olive oil (1 tbs) / 1.8 g   Choose: Corn oil (1 tbs) / 1.7 g   Choose: Safflower oil (1 tbs) / 1.2 g   Choose: Sunflower oil (1 tbs) / 1.4 g   Choose: Soybean oil (1 tbs) / 2.4 g   Choose: Canola oil (1 tbs) / 1 g  Phentermine tablets or capsules What is this medicine? PHENTERMINE (FEN ter meen) decreases your appetite. It is used with a reduced calorie diet and exercise to help you lose weight. This medicine may be used for other purposes; ask your health care provider or pharmacist if you have questions. COMMON BRAND NAME(S): Adipex-P, Atti-Plex P, Atti-Plex P Spansule, Fastin, Pro-Fast, Tara-8 What should I tell my health care provider before I take this medicine? They need to know if you have any of these conditions: -agitation -glaucoma -heart disease -high blood pressure -history of substance abuse -lung disease called Primary Pulmonary Hypertension (PPH) -taken an MAOI like Carbex, Eldepryl, Marplan, Nardil, or Parnate in last 14 days -thyroid disease -an unusual or allergic reaction to phentermine, other medicines, foods, dyes, or preservatives -pregnant or trying to get pregnant -breast-feeding How should I use this medicine? Take this medicine by mouth with a glass of water. Follow the directions on the prescription label. This medicine is usually taken 30 minutes before or 1 to 2 hours after breakfast. Avoid taking this medicine in the evening. It may interfere with sleep. Take your doses at regular intervals. Do not take your medicine more often than directed. Talk to your pediatrician regarding the use of  this medicine in children. Special care may be needed. Overdosage: If you think you have taken too much of this medicine contact a poison control center or emergency room at once. NOTE: This medicine is only for you. Do not share this medicine with others. What if I miss a dose? If you miss a dose, take it as soon as you can. If it is almost time for your next dose, take only that dose. Do not take double or extra doses. What may interact with this medicine? Do not take this medicine with any of the following medications: -duloxetine -MAOIs like Carbex, Eldepryl, Marplan, Nardil, and Parnate -medicines for colds or breathing difficulties like pseudoephedrine or phenylephrine -procarbazine -sibutramine -SSRIs like citalopram, escitalopram, fluoxetine, fluvoxamine, paroxetine, and sertraline -stimulants like dexmethylphenidate, methylphenidate or modafinil -venlafaxine This medicine may also interact with the following medications: -medicines for diabetes This list may not describe all possible interactions. Give your health care provider a list of all the medicines, herbs, non-prescription drugs, or dietary supplements you use. Also tell them if you smoke, drink alcohol, or use illegal drugs. Some items may interact with your medicine. What should I watch for while using this medicine? Notify your physician immediately if you become short of breath while doing your normal activities. Do not take this medicine within 6 hours of bedtime. It can keep you from getting to sleep. Avoid drinks that contain caffeine and try to stick to a regular bedtime every night. This medicine was intended to be used in addition to a healthy diet and exercise. The best results are achieved this way. This medicine is only indicated for short-term use. Eventually your weight loss may level out. At that point, the drug will only help you maintain  your new weight. Do not increase or in any way change your dose without  consulting your doctor. You may get drowsy or dizzy. Do not drive, use machinery, or do anything that needs mental alertness until you know how this medicine affects you. Do not stand or sit up quickly, especially if you are an older patient. This reduces the risk of dizzy or fainting spells. Alcohol may increase dizziness and drowsiness. Avoid alcoholic drinks. What side effects may I notice from receiving this medicine? Side effects that you should report to your doctor or health care professional as soon as possible: -chest pain, palpitations -depression or severe changes in mood -increased blood pressure -irritability -nervousness or restlessness -severe dizziness -shortness of breath -problems urinating -unusual swelling of the legs -vomiting Side effects that usually do not require medical attention (report to your doctor or health care professional if they continue or are bothersome): -blurred vision or other eye problems -changes in sexual ability or desire -constipation or diarrhea -difficulty sleeping -dry mouth or unpleasant taste -headache -nausea This list may not describe all possible side effects. Call your doctor for medical advice about side effects. You may report side effects to FDA at 1-800-FDA-1088. Where should I keep my medicine? Keep out of the reach of children. This medicine can be abused. Keep your medicine in a safe place to protect it from theft. Do not share this medicine with anyone. Selling or giving away this medicine is dangerous and against the law. Store at room temperature between 20 and 25 degrees C (68 and 77 degrees F). Keep container tightly closed. Throw away any unused medicine after the expiration date. NOTE: This sheet is a summary. It may not cover all possible information. If you have questions about this medicine, talk to your doctor, pharmacist, or health care provider.  2015, [employer]. (2010-12-17 11:02:44)

## 2014-08-10 LAB — URINALYSIS W MICROSCOPIC + REFLEX CULTURE
Bilirubin Urine: NEGATIVE
Casts: NONE SEEN
Crystals: NONE SEEN
GLUCOSE, UA: NEGATIVE mg/dL
HGB URINE DIPSTICK: NEGATIVE
Ketones, ur: NEGATIVE mg/dL
LEUKOCYTES UA: NEGATIVE
NITRITE: NEGATIVE
PROTEIN: NEGATIVE mg/dL
Specific Gravity, Urine: 1.017 (ref 1.005–1.030)
UROBILINOGEN UA: 0.2 mg/dL (ref 0.0–1.0)
pH: 6 (ref 5.0–8.0)

## 2014-08-10 LAB — HEPATITIS C ANTIBODY: HCV AB: NEGATIVE

## 2014-08-12 LAB — URINE CULTURE

## 2014-08-16 ENCOUNTER — Telehealth: Payer: Self-pay

## 2014-08-16 NOTE — Telephone Encounter (Signed)
Patient has a follow up visit scheduled on 11/3 at noon. She asked if you could just talk with her then about her cholesterol or if she would have to have another appointment?

## 2014-08-16 NOTE — Telephone Encounter (Signed)
Yes

## 2014-08-16 NOTE — Telephone Encounter (Signed)
Message copied by Keenan BachelorANNAS, Ilyaas Musto R on Thu Aug 16, 2014  4:18 PM ------      Message from: Ok EdwardsFERNANDEZ, JUAN H      Created: Fri Aug 10, 2014 11:32 PM       Please inform patient that her lipid profile is elevated in comparison to next year. We will need to see her in consultation since it appears we will need to start her on medication. ------

## 2014-08-27 ENCOUNTER — Other Ambulatory Visit: Payer: Self-pay | Admitting: Gynecology

## 2014-09-17 ENCOUNTER — Encounter: Payer: Self-pay | Admitting: Gynecology

## 2014-09-18 ENCOUNTER — Ambulatory Visit: Payer: No Typology Code available for payment source | Admitting: Gynecology

## 2014-10-09 ENCOUNTER — Ambulatory Visit: Payer: No Typology Code available for payment source | Admitting: Gynecology

## 2014-11-06 ENCOUNTER — Encounter: Payer: Self-pay | Admitting: Gynecology

## 2014-11-06 ENCOUNTER — Ambulatory Visit (INDEPENDENT_AMBULATORY_CARE_PROVIDER_SITE_OTHER): Payer: No Typology Code available for payment source

## 2014-11-06 ENCOUNTER — Ambulatory Visit (INDEPENDENT_AMBULATORY_CARE_PROVIDER_SITE_OTHER): Payer: No Typology Code available for payment source | Admitting: Gynecology

## 2014-11-06 VITALS — BP 132/88 | Wt 136.4 lb

## 2014-11-06 DIAGNOSIS — E785 Hyperlipidemia, unspecified: Secondary | ICD-10-CM

## 2014-11-06 DIAGNOSIS — Z79890 Hormone replacement therapy (postmenopausal): Secondary | ICD-10-CM

## 2014-11-06 DIAGNOSIS — R1031 Right lower quadrant pain: Secondary | ICD-10-CM

## 2014-11-06 DIAGNOSIS — M8580 Other specified disorders of bone density and structure, unspecified site: Secondary | ICD-10-CM | POA: Diagnosis not present

## 2014-11-06 DIAGNOSIS — R635 Abnormal weight gain: Secondary | ICD-10-CM

## 2014-11-06 MED ORDER — ROSUVASTATIN CALCIUM 10 MG PO TABS: 10.0000 mg | ORAL_TABLET | Freq: Every day | ORAL | Status: DC

## 2014-11-06 MED ORDER — PHENTERMINE HCL 37.5 MG PO CAPS
37.5000 mg | ORAL_CAPSULE | ORAL | Status: DC
Start: 2014-11-06 — End: 2015-12-18

## 2014-11-06 NOTE — Patient Instructions (Signed)
Rosuvastatin Tablets What is this medicine? ROSUVASTATIN (roe SOO va sta tin) is known as a HMG-CoA reductase inhibitor or 'statin'. It lowers cholesterol and triglycerides in the blood. This drug may also reduce the risk of heart attack, stroke, or other health problems in patients with risk factors for heart disease. Diet and lifestyle changes are often used with this drug. This medicine may be used for other purposes; ask your health care provider or pharmacist if you have questions. COMMON BRAND NAME(S): Crestor What should I tell my health care provider before I take this medicine? They need to know if you have any of these conditions: -frequently drink alcoholic beverages -kidney disease -liver disease -muscle aches or weakness -other medical condition -an unusual or allergic reaction to rosuvastatin, other medicines, foods, dyes, or preservatives -pregnant or trying to get pregnant -breast-feeding How should I use this medicine? Take this medicine by mouth with a glass of water. Follow the directions on the prescription label. Do not cut, crush or chew this medicine. You can take this medicine with or without food. Take your doses at regular intervals. Do not take your medicine more often than directed. Talk to your pediatrician regarding the use of this medicine in children. While this drug may be prescribed for children as young as 86 years old for selected conditions, precautions do apply. Overdosage: If you think you have taken too much of this medicine contact a poison control center or emergency room at once. NOTE: This medicine is only for you. Do not share this medicine with others. What if I miss a dose? If you miss a dose, take it as soon as you can. Do not take 2 doses within 12 hours of each other. If there are less than 12 hours until your next dose, take only that dose. Do not take double or extra doses. What may interact with this medicine? Do not take this medicine with  any of the following medications: -herbal medicines like red yeast rice This medicine may also interact with the following medications: -alcohol -antacids containing aluminum hydroxide or magnesium hydroxide -cyclosporine -other medicines for high cholesterol -some medicines for HIV infection -warfarin This list may not describe all possible interactions. Give your health care provider a list of all the medicines, herbs, non-prescription drugs, or dietary supplements you use. Also tell them if you smoke, drink alcohol, or use illegal drugs. Some items may interact with your medicine. What should I watch for while using this medicine? Visit your doctor or health care professional for regular check-ups. You may need regular tests to make sure your liver is working properly. Tell your doctor or health care professional right away if you get any unexplained muscle pain, tenderness, or weakness, especially if you also have a fever and tiredness. Your doctor or health care professional may tell you to stop taking this medicine if you develop muscle problems. If your muscle problems do not go away after stopping this medicine, contact your health care professional. This medicine may affect blood sugar levels. If you have diabetes, check with your doctor or health care professional before you change your diet or the dose of your diabetic medicine. Avoid taking antacids containing aluminum, calcium or magnesium within 2 hours of taking this medicine. This drug is only part of a total heart-health program. Your doctor or a dietician can suggest a low-cholesterol and low-fat diet to help. Avoid alcohol and smoking, and keep a proper exercise schedule. Do not use this drug if you  are pregnant or breast-feeding. Serious side effects to an unborn child or to an infant are possible. Talk to your doctor or pharmacist for more information. What side effects may I notice from receiving this medicine? Side effects  that you should report to your doctor or health care professional as soon as possible: -allergic reactions like skin rash, itching or hives, swelling of the face, lips, or tongue -dark urine -fever -joint pain -muscle cramps, pain -redness, blistering, peeling or loosening of the skin, including inside the mouth -trouble passing urine or change in the amount of urine -unusually weak or tired -yellowing of the eyes or skin Side effects that usually do not require medical attention (report to your doctor or health care professional if they continue or are bothersome): -constipation -heartburn -nausea -stomach gas, pain, upset This list may not describe all possible side effects. Call your doctor for medical advice about side effects. You may report side effects to FDA at 1-800-FDA-1088. Where should I keep my medicine? Keep out of the reach of children. Store at room temperature between 20 and 25 degrees C (68 and 77 degrees F). Keep container tightly closed (protect from moisture). Throw away any unused medicine after the expiration date. NOTE: This sheet is a summary. It may not cover all possible information. If you have questions about this medicine, talk to your doctor, pharmacist, or health care provider.  2015, [employer] Standard. (2013-06-12 11:36:50) Food Choices to Lower Your Triglycerides  Triglycerides are a type of fat in your blood. High levels of triglycerides can increase the risk of heart disease and stroke. If your triglyceride levels are high, the foods you eat and your eating habits are very important. Choosing the right foods can help lower your triglycerides.  WHAT GENERAL GUIDELINES DO I NEED TO FOLLOW?  Lose weight if you are overweight.   Limit or avoid alcohol.   Fill one half of your plate with vegetables and green salads.   Limit fruit to two servings a day. Choose fruit instead of juice.   Make one fourth of your plate whole grains. Look for the  word "whole" as the first word in the ingredient list.  Fill one fourth of your plate with lean protein foods.  Enjoy fatty fish (such as salmon, mackerel, sardines, and tuna) three times a week.   Choose healthy fats.   Limit foods high in starch and sugar.  Eat more home-cooked food and less restaurant, buffet, and fast food.  Limit fried foods.  Cook foods using methods other than frying.  Limit saturated fats.  Check ingredient lists to avoid foods with partially hydrogenated oils (trans fats) in them. WHAT FOODS CAN I EAT?  Grains Whole grains, such as whole wheat or whole grain breads, crackers, cereals, and pasta. Unsweetened oatmeal, bulgur, barley, quinoa, or brown rice. Corn or whole wheat flour tortillas.  Vegetables Fresh or frozen vegetables (raw, steamed, roasted, or grilled). Green salads. Fruits All fresh, canned (in natural juice), or frozen fruits. Meat and Other Protein Products Ground beef (85% or leaner), grass-fed beef, or beef trimmed of fat. Skinless chicken or [place]. Ground chicken or [place]. Pork trimmed of fat. All fish and seafood. Eggs. Dried beans, peas, or lentils. Unsalted nuts or seeds. Unsalted canned or dry beans. Dairy Low-fat dairy products, such as skim or 1% milk, 2% or reduced-fat cheeses, low-fat ricotta or cottage cheese, or plain low-fat yogurt. Fats and Oils Tub margarines without trans fats. Light or reduced-fat mayonnaise and salad dressings.  Avocado. Safflower, olive, or canola oils. Natural peanut or almond butter. The items listed above may not be a complete list of recommended foods or beverages. Contact your dietitian for more options. WHAT FOODS ARE NOT RECOMMENDED?  Grains White bread. White pasta. White rice. Cornbread. Bagels, pastries, and croissants. Crackers that contain trans fat. Vegetables White potatoes. Corn. Creamed or fried vegetables. Vegetables in a cheese sauce. Fruits Dried fruits. Canned fruit in light or  heavy syrup. Fruit juice. Meat and Other Protein Products Fatty cuts of meat. Ribs, chicken wings, bacon, sausage, bologna, salami, chitterlings, fatback, hot dogs, bratwurst, and packaged luncheon meats. Dairy Whole or 2% milk, cream, half-and-half, and cream cheese. Whole-fat or sweetened yogurt. Full-fat cheeses. Nondairy creamers and whipped toppings. Processed cheese, cheese spreads, or cheese curds. Sweets and Desserts Corn syrup, sugars, honey, and molasses. Candy. Jam and jelly. Syrup. Sweetened cereals. Cookies, pies, cakes, donuts, muffins, and ice cream. Fats and Oils Butter, stick margarine, lard, shortening, ghee, or bacon fat. Coconut, palm kernel, or palm oils. Beverages Alcohol. Sweetened drinks (such as sodas, lemonade, and fruit drinks or punches). The items listed above may not be a complete list of foods and beverages to avoid. Contact your dietitian for more information. Document Released: 08/20/2004 Document Revised: 11/07/2013 Document Reviewed: 09/06/2013 [facility] Patient Information 2015 [place], [place]. This information is not intended to replace advice given to you by your health care provider. Make sure you discuss any questions you have with your health care provider.

## 2014-11-06 NOTE — Progress Notes (Signed)
Patient presented to the office today to discuss  Several issues and past lab results. Patient was seen in the office for her annual exam in 08/09/2014. 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 Since 2012 she had been on Vivelle-Dot 0.0375 transdermal patch twice a week for vasomotor symptoms but she has been concerned about weight gain. She had been started on phentermine 37.5 mg 1 by mouth daily but due to her traveling from work she only  Took it for 1 month and would like to restart it again. She is interested  In attempting to come off of it to see if it will help with her weight as well.   Patient also was having some vague right lower abdominal discomfort which she attributes to lifting her grandson but wanted to be checked just to make sure. Patient does have history of osteopenia although we do not have the report from previous provider. She scheduled have her bone density study today.    Patient was also found to have elevated triglycerides an VLDL on recent fasting lipid profile. Patient has informed me that her previous provider Dr. Nicholas LoseLomax had her on Lipitor 10 mg daily whereby she would cut the tablet in half and she had no side effects but just discontinued after  One-2 years. Her lipid profile was compared with 2014 as follows:  Results for Donna Baker, Loukisha S (MRN [id number]) as of 11/06/2014 11:31  Ref. Range 06/15/2013 13:05 08/09/2014 10:58  Cholesterol Latest Range: 0-200 mg/dL 782205 (H) 956230 (H)  Triglycerides Latest Range: <150 mg/dL 213123 086161 (H)  HDL Latest Range: >39 mg/dL 67 61  LDL (calc) Latest Range: 0-99 mg/dL 578113 (H) 469137 (H)  VLDL Latest Range: 0-40 mg/dL 25 32  Total CHOL/HDL Ratio No range found 3.1 3.8    Patient states that she exercises 3-4 times a week and is trying to eat healthy. We discussed about starting her on Crestor 10 mg whereby she would break the tablet in half daily.     exam: Back: No CVA tenderness Abdomen: Soft nontender no rebound or guarding  negative Rovsing , negative obturator, negative heel tap sign    On 30 leg raises patient had no abdominal or back discomfort   Patient with no urinary frequency or problems with urination or bowel movements.    Assessment/plan: New #1 menopause 3 years on transdermal estrogen we'll discontinue and monitor symptoms for the past month if symptoms returned she'll restart the transdermal patch. We discussed the women's health initiative study. We discussed the risk benefits and pros and cons of hormone replacement therapy. Alternative would be to use peppermint oil one dab behind  Her ear once a day. #2 abdominal discomfort probably attributed to straining from lifting her grandchild benign abdominal exam.  #3 hyperlipidemia (elevated triglycerides an LDL which of continued to rise from last year. We discussed the risk and benefits and pros and cons of statins to include myalgia , abdominal pains, arthralgias an reported cases of diabetes after long-term use. Patient will be prescribed Crestor 10 mg for which she is to posted take half a tablet daily. She will return back in 6 months for fasting lipid profile and SGOT and SGPT. Conference metabolic panel done a few weeks ago was normal here in our office.  # f4  Weight gain we discussed importance of regular exercise. She will be restarted on phentermine 37.5 mg  One by  mouth daily for 3 months. She'll return back in one month 4 blood pressure measurement , weight measurement, cardiac and pulmonary auscultation. Literature information present been provided on potential risk of long-term use of phentermine to include pulmonary hypertension and cardiac valvular disease.  #5 patient with history of osteopenia we'll have her bone density study done today

## 2015-09-26 ENCOUNTER — Other Ambulatory Visit: Payer: Self-pay

## 2015-09-30 ENCOUNTER — Other Ambulatory Visit: Payer: Self-pay | Admitting: Gynecology

## 2015-10-16 ENCOUNTER — Other Ambulatory Visit: Payer: Self-pay

## 2015-10-16 DIAGNOSIS — Z1231 Encounter for screening mammogram for malignant neoplasm of breast: Secondary | ICD-10-CM

## 2015-10-24 ENCOUNTER — Ambulatory Visit
Admission: RE | Admit: 2015-10-24 | Discharge: 2015-10-24 | Disposition: A | Discharge status: Home / Self Care | Payer: PRIVATE HEALTH INSURANCE | Source: Ambulatory Visit

## 2015-10-24 DIAGNOSIS — Z1231 Encounter for screening mammogram for malignant neoplasm of breast: Secondary | ICD-10-CM

## 2015-11-22 ENCOUNTER — Encounter: Payer: No Typology Code available for payment source | Admitting: Gynecology

## 2015-12-07 ENCOUNTER — Other Ambulatory Visit: Payer: Self-pay | Admitting: Gynecology

## 2015-12-09 NOTE — Telephone Encounter (Signed)
Annual scheduled on 12/18/15

## 2015-12-18 ENCOUNTER — Encounter: Payer: Self-pay | Admitting: Gynecology

## 2015-12-18 ENCOUNTER — Ambulatory Visit (INDEPENDENT_AMBULATORY_CARE_PROVIDER_SITE_OTHER): Payer: No Typology Code available for payment source | Admitting: Gynecology

## 2015-12-18 VITALS — BP 120/76 | Ht 60.0 in | Wt 142.0 lb

## 2015-12-18 DIAGNOSIS — R635 Abnormal weight gain: Secondary | ICD-10-CM

## 2015-12-18 DIAGNOSIS — E8770 Fluid overload, unspecified: Secondary | ICD-10-CM

## 2015-12-18 DIAGNOSIS — M8580 Other specified disorders of bone density and structure, unspecified site: Secondary | ICD-10-CM

## 2015-12-18 DIAGNOSIS — Z01419 Encounter for gynecological examination (general) (routine) without abnormal findings: Secondary | ICD-10-CM | POA: Diagnosis not present

## 2015-12-18 DIAGNOSIS — R609 Edema, unspecified: Secondary | ICD-10-CM

## 2015-12-18 LAB — COMPREHENSIVE METABOLIC PANEL
ALT: 20 U/L (ref 6–29)
AST: 22 U/L (ref 10–35)
Albumin: 4.5 g/dL (ref 3.6–5.1)
Alkaline Phosphatase: 40 U/L (ref 33–130)
BUN: 15 mg/dL (ref 7–25)
CALCIUM: 9.6 mg/dL (ref 8.6–10.4)
CHLORIDE: 104 mmol/L (ref 98–110)
CO2: 24 mmol/L (ref 20–31)
Creat: 0.74 mg/dL (ref 0.50–0.99)
GLUCOSE: 95 mg/dL (ref 65–99)
POTASSIUM: 4.1 mmol/L (ref 3.5–5.3)
Sodium: 138 mmol/L (ref 135–146)
Total Bilirubin: 0.4 mg/dL (ref 0.2–1.2)
Total Protein: 7 g/dL (ref 6.1–8.1)

## 2015-12-18 LAB — CBC WITH DIFFERENTIAL/PLATELET
Basophils Absolute: 0 10*3/uL (ref 0.0–0.1)
Basophils Relative: 1 % (ref 0–1)
EOS PCT: 3 % (ref 0–5)
Eosinophils Absolute: 0.1 10*3/uL (ref 0.0–0.7)
HEMATOCRIT: 40.1 % (ref 36.0–46.0)
Hemoglobin: 13.3 g/dL (ref 12.0–15.0)
LYMPHS ABS: 1.2 10*3/uL (ref 0.7–4.0)
LYMPHS PCT: 27 % (ref 12–46)
MCH: 30.4 pg (ref 26.0–34.0)
MCHC: 33.2 g/dL (ref 30.0–36.0)
MCV: 91.8 fL (ref 78.0–100.0)
MONO ABS: 0.3 10*3/uL (ref 0.1–1.0)
MPV: 10 fL (ref 8.6–12.4)
Monocytes Relative: 7 % (ref 3–12)
Neutro Abs: 2.7 10*3/uL (ref 1.7–7.7)
Neutrophils Relative %: 62 % (ref 43–77)
Platelets: 331 10*3/uL (ref 150–400)
RBC: 4.37 MIL/uL (ref 3.87–5.11)
RDW: 14.5 % (ref 11.5–15.5)
WBC: 4.3 10*3/uL (ref 4.0–10.5)

## 2015-12-18 LAB — LIPID PANEL
CHOL/HDL RATIO: 2.8 ratio (ref <=5.0)
Cholesterol: 191 mg/dL (ref 125–200)
HDL: 68 mg/dL (ref >=46)
LDL CALC: 104 mg/dL (ref <130)
Triglycerides: 97 mg/dL (ref <150)
VLDL: 19 mg/dL (ref <30)

## 2015-12-18 LAB — TSH: TSH: 1.834 u[IU]/mL (ref 0.350–4.500)

## 2015-12-18 MED ORDER — PHENTERMINE HCL 37.5 MG PO CAPS: 37.5000 mg | ORAL_CAPSULE | ORAL | Status: DC

## 2015-12-18 MED ORDER — NITROFURANTOIN MONOHYD MACRO 100 MG PO CAPS: 100.0000 mg | ORAL_CAPSULE | Freq: Two times a day (BID) | ORAL | Status: DC

## 2015-12-18 MED ORDER — TRIAMTERENE-HCTZ 75-50 MG PO TABS: ORAL_TABLET | ORAL | Status: DC

## 2015-12-18 MED ORDER — ESTRADIOL 0.0375 MG/24HR TD PTTW: 1.0000 | MEDICATED_PATCH | TRANSDERMAL | Status: DC

## 2015-12-18 NOTE — Patient Instructions (Signed)
Phentermine tablets or capsules What is this medicine? PHENTERMINE (FEN ter meen) decreases your appetite. It is used with a reduced calorie diet and exercise to help you lose weight. This medicine may be used for other purposes; ask your health care provider or pharmacist if you have questions. What should I tell my health care provider before I take this medicine? They need to know if you have any of these conditions: -agitation -glaucoma -heart disease -high blood pressure -history of substance abuse -lung disease called Primary Pulmonary Hypertension (PPH) -taken an MAOI like Carbex, Eldepryl, Marplan, Nardil, or Parnate in last 14 days -thyroid disease -an unusual or allergic reaction to phentermine, other medicines, foods, dyes, or preservatives -pregnant or trying to get pregnant -breast-feeding How should I use this medicine? Take this medicine by mouth with a glass of water. Follow the directions on the prescription label. This medicine is usually taken 30 minutes before or 1 to 2 hours after breakfast. Avoid taking this medicine in the evening. It may interfere with sleep. Take your doses at regular intervals. Do not take your medicine more often than directed. Talk to your pediatrician regarding the use of this medicine in children. Special care may be needed. Overdosage: If you think you have taken too much of this medicine contact a poison control center or emergency room at once. NOTE: This medicine is only for you. Do not share this medicine with others. What if I miss a dose? If you miss a dose, take it as soon as you can. If it is almost time for your next dose, take only that dose. Do not take double or extra doses. What may interact with this medicine? Do not take this medicine with any of the following medications: -duloxetine -MAOIs like Carbex, Eldepryl, Marplan, Nardil, and Parnate -medicines for colds or breathing difficulties like pseudoephedrine or  phenylephrine -procarbazine -sibutramine -SSRIs like citalopram, escitalopram, fluoxetine, fluvoxamine, paroxetine, and sertraline -stimulants like dexmethylphenidate, methylphenidate or modafinil -venlafaxine This medicine may also interact with the following medications: -medicines for diabetes This list may not describe all possible interactions. Give your health care provider a list of all the medicines, herbs, non-prescription drugs, or dietary supplements you use. Also tell them if you smoke, drink alcohol, or use illegal drugs. Some items may interact with your medicine. What should I watch for while using this medicine? Notify your physician immediately if you become short of breath while doing your normal activities. Do not take this medicine within 6 hours of bedtime. It can keep you from getting to sleep. Avoid drinks that contain caffeine and try to stick to a regular bedtime every night. This medicine was intended to be used in addition to a healthy diet and exercise. The best results are achieved this way. This medicine is only indicated for short-term use. Eventually your weight loss may level out. At that point, the drug will only help you maintain your new weight. Do not increase or in any way change your dose without consulting your doctor. You may get drowsy or dizzy. Do not drive, use machinery, or do anything that needs mental alertness until you know how this medicine affects you. Do not stand or sit up quickly, especially if you are an older patient. This reduces the risk of dizzy or fainting spells. Alcohol may increase dizziness and drowsiness. Avoid alcoholic drinks. What side effects may I notice from receiving this medicine? Side effects that you should report to your doctor or health care professional as soon   as possible: -chest pain, palpitations -depression or severe changes in mood -increased blood pressure -irritability -nervousness or restlessness -severe  dizziness -shortness of breath -problems urinating -unusual swelling of the legs -vomiting Side effects that usually do not require medical attention (report to your doctor or health care professional if they continue or are bothersome): -blurred vision or other eye problems -changes in sexual ability or desire -constipation or diarrhea -difficulty sleeping -dry mouth or unpleasant taste -headache -nausea This list may not describe all possible side effects. Call your doctor for medical advice about side effects. You may report side effects to FDA at 1-800-FDA-1088. Where should I keep my medicine? Keep out of the reach of children. This medicine can be abused. Keep your medicine in a safe place to protect it from theft. Do not share this medicine with anyone. Selling or giving away this medicine is dangerous and against the law. This medicine may cause accidental overdose and death if taken by other adults, children, or pets. Mix any unused medicine with a substance like cat litter or coffee grounds. Then throw the medicine away in a sealed container like a sealed bag or a coffee can with a lid. Do not use the medicine after the expiration date. Store at room temperature between 20 and 25 degrees C (68 and 77 degrees F). Keep container tightly closed. NOTE: This sheet is a summary. It may not cover all possible information. If you have questions about this medicine, talk to your doctor, pharmacist, or health care provider.    2016, [employer]vier/[employer] Standard. (2014-07-24 16:19:53)  

## 2015-12-18 NOTE — Progress Notes (Signed)
Donna Baker 16-Sep-1956 [id number]   History:    60 y.o.  for annual gyn exam with no complaints today. Review of patient's record indicated that she has history of hyperlipidemia last year had been placed on Crestor discontinued due to side effects. She has purchased over-the-counter red yeast rice at the health food store and has been taking along with trying to exercise. Review of her record indicating that she was weighing 136 pounds last year and is up to 142 pounds now. Last year she had been placed on phentermine 37.5 mg daily for 3 months as a appetite suppressant but only took it for 1 month and would like to try again this year for at least 3 months.  Patient had previously been followed for many years by Dr. Nicholas Lose who is retired. Patient is a gravida 3 para 2 AB 1. Her PCP has been doing her lab work in the past but she wanted to have her lab work done here in our office. 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 Patient also had a colonoscopy in 2014 by Dr. Clent Ridges whereby benign polyps were removed. Patient had a bone density study here in December 2015 and the lowest T score was -1.7 at the left femoral neck and she had a normal Frax analysis. Her to patient's hysterectomy she's always had normal Pap smears.    Past medical history,surgical history, family history and social history were all reviewed and documented in the EPIC chart.  Gynecologic History No LMP recorded. Patient has had a hysterectomy. Contraception: status post hysterectomy Last Pap: 2013. Results were: normal Last mammogram: 2016. Results were: Normal but dense had three-dimensional mammogram  Obstetric  History OB History  Gravida Para Term Preterm AB SAB TAB Ectopic Multiple Living  # Outcome Date GA Lbr Len/2nd Weight Sex Delivery Anes PTL Lv  3 SAB           2 Para           1 Para                ROS: A ROS was performed and pertinent positives and negatives are included in the history.  GENERAL: No fevers or chills. HEENT: No change in vision, no earache, sore throat or sinus congestion. NECK: No pain or stiffness. CARDIOVASCULAR: No chest pain or pressure. No palpitations. PULMONARY: No shortness of breath, cough or wheeze. GASTROINTESTINAL: No abdominal pain, nausea, vomiting or diarrhea, melena or bright red blood per rectum. GENITOURINARY: No urinary frequency, urgency, hesitancy or dysuria. MUSCULOSKELETAL: No joint or muscle pain, no back pain, no recent trauma. DERMATOLOGIC: No rash, no itching, no lesions. ENDOCRINE: No polyuria, polydipsia, no heat or cold intolerance. No recent change in weight. HEMATOLOGICAL: No anemia or easy bruising or bleeding. NEUROLOGIC: No headache, seizures, numbness, tingling or weakness. PSYCHIATRIC: No depression, no loss of interest in normal activity or change in sleep pattern.     Exam: chaperone present  BP 120/76 mmHg  Ht 5' (1.524 m)  Wt 142 lb (64.411 kg)  BMI 27.73 kg/m2  Body mass index is 27.73 kg/(m^2).  General appearance : Well developed well  nourished female. No acute distress HEENT: Eyes: no retinal hemorrhage or exudates,  Neck supple, trachea midline, no carotid bruits, no thyroidmegaly Lungs: Clear to auscultation, no rhonchi or wheezes, or rib retractions  Heart: Regular rate and rhythm, no murmurs or gallops Breast:Examined in sitting and supine position were symmetrical in appearance, no palpable masses or tenderness,  no skin retraction, no nipple inversion, no nipple discharge, no skin discoloration, no axillary or supraclavicular lymphadenopathy Abdomen: no palpable masses or tenderness, no rebound  or guarding Extremities: no edema or skin discoloration or tenderness  Pelvic:  Bartholin, Urethra, Skene Glands: Within normal limits             Vagina: No gross lesions or discharge  Cervix: Absent  Uterus absent  Adnexa  Without masses or tenderness  Anus and perineum  normal   Rectovaginal  normal sphincter tone without palpated masses or tenderness             Hemoccult recently provided by the gastroenterologist     Assessment/Plan:  60 y.o. female for annual exam with past history of hyperlipidemia had stopped the Crestor. She is fasting today so we'll recheck her fasting lipid profile today and manage accordingly. Also in addition she will have the following screening blood work: Comprehensive metabolic panel, TSH, CBC, and urinalysis. Pap smear not indicated according to the new guidelines. Patient will need a bone density study in December 2017. Prescription refill for Vivelle-Dot 0.0375 transdermal patch to apply twice a week was provided. Risk benefits and pros and cons were once again discussed. We are also going to check her vitamin D level because of her past history of osteopenia. She's also going to be started on phentermine 37.5 mg take 1 pill daily for 3 months. She will return to the office monthly for pulmonary and cardiac auscultation as well as weight management. The risks benefits and pros and cons were discussed with the patient to include cardiac valvular disease as well as pulmonary hypertension. Patient fully understands and accepts. Patient travels a lot is sometimes she gets urinary tract infection 1 m provided with her prescription for Macrobid for which I will write for one DOB 7 days worth in the event Y she's traveling she has a urinary tract infection. She was also requesting a refill for Maxide 75/50 mg tablet which she takes. Frequently only if she has fluid retention. Patient was reminded of the importance of monthly breast exams. She is due for her mammogram at  the end of this year.   Ok Edwards MD, 9:07 AM 12/18/2015

## 2015-12-19 ENCOUNTER — Encounter: Payer: Self-pay | Admitting: Gynecology

## 2015-12-19 LAB — URINALYSIS W MICROSCOPIC + REFLEX CULTURE
Bacteria, UA: NONE SEEN [HPF]
Bilirubin Urine: NEGATIVE
CASTS: NONE SEEN [LPF]
Crystals: NONE SEEN [HPF]
Glucose, UA: NEGATIVE
HGB URINE DIPSTICK: NEGATIVE
Ketones, ur: NEGATIVE
Leukocytes, UA: NEGATIVE
NITRITE: NEGATIVE
PH: 5.5 (ref 5.0–8.0)
Protein, ur: NEGATIVE
SPECIFIC GRAVITY, URINE: 1.018 (ref 1.001–1.035)
YEAST: NONE SEEN [HPF]

## 2015-12-19 LAB — VITAMIN D 25 HYDROXY (VIT D DEFICIENCY, FRACTURES): Vit D, 25-Hydroxy: 13 ng/mL — ABNORMAL LOW (ref 30–100)

## 2015-12-20 LAB — URINE CULTURE
Colony Count: NO GROWTH
ORGANISM ID, BACTERIA: NO GROWTH

## 2015-12-24 ENCOUNTER — Other Ambulatory Visit: Payer: Self-pay | Admitting: Gynecology

## 2015-12-24 DIAGNOSIS — E559 Vitamin D deficiency, unspecified: Secondary | ICD-10-CM

## 2015-12-24 MED ORDER — VITAMIN D (ERGOCALCIFEROL) 1.25 MG (50000 UNIT) PO CAPS: 50000.0000 [IU] | ORAL_CAPSULE | ORAL | Status: DC

## 2016-02-10 ENCOUNTER — Encounter: Payer: Self-pay | Admitting: Gynecology

## 2016-05-29 ENCOUNTER — Other Ambulatory Visit: Payer: Self-pay

## 2016-05-31 MED ORDER — TRIAMTERENE-HCTZ 75-50 MG PO TABS: ORAL_TABLET | ORAL | Status: DC

## 2016-12-04 DIAGNOSIS — J329 Chronic sinusitis, unspecified: Secondary | ICD-10-CM | POA: Diagnosis not present

## 2017-01-14 ENCOUNTER — Other Ambulatory Visit: Payer: Self-pay | Admitting: Gynecology

## 2017-01-14 DIAGNOSIS — Z1231 Encounter for screening mammogram for malignant neoplasm of breast: Secondary | ICD-10-CM

## 2017-02-02 ENCOUNTER — Ambulatory Visit: Payer: PRIVATE HEALTH INSURANCE

## 2017-02-09 ENCOUNTER — Other Ambulatory Visit: Payer: Self-pay | Admitting: Gynecology

## 2017-02-17 ENCOUNTER — Other Ambulatory Visit: Payer: Self-pay | Admitting: *Deleted

## 2017-03-02 ENCOUNTER — Ambulatory Visit
Admission: RE | Admit: 2017-03-02 | Discharge: 2017-03-02 | Disposition: A | Discharge status: Home / Self Care | Payer: BLUE CROSS/BLUE SHIELD | Source: Ambulatory Visit | Attending: Gynecology | Admitting: Gynecology

## 2017-03-02 DIAGNOSIS — Z1231 Encounter for screening mammogram for malignant neoplasm of breast: Secondary | ICD-10-CM

## 2017-03-04 ENCOUNTER — Other Ambulatory Visit: Payer: Self-pay | Admitting: Gynecology

## 2017-03-04 DIAGNOSIS — R928 Other abnormal and inconclusive findings on diagnostic imaging of breast: Secondary | ICD-10-CM

## 2017-03-08 ENCOUNTER — Ambulatory Visit
Admission: RE | Admit: 2017-03-08 | Discharge: 2017-03-08 | Disposition: A | Discharge status: Home / Self Care | Payer: BLUE CROSS/BLUE SHIELD | Source: Ambulatory Visit | Attending: Gynecology | Admitting: Gynecology

## 2017-03-08 DIAGNOSIS — N6311 Unspecified lump in the right breast, upper outer quadrant: Secondary | ICD-10-CM | POA: Diagnosis not present

## 2017-03-08 DIAGNOSIS — R928 Other abnormal and inconclusive findings on diagnostic imaging of breast: Secondary | ICD-10-CM

## 2017-03-26 ENCOUNTER — Other Ambulatory Visit: Payer: Self-pay | Admitting: Gynecology

## 2017-03-31 ENCOUNTER — Encounter: Payer: Self-pay | Admitting: Gynecology

## 2017-04-21 ENCOUNTER — Encounter: Payer: No Typology Code available for payment source | Admitting: Gynecology

## 2017-05-05 ENCOUNTER — Encounter: Payer: No Typology Code available for payment source | Admitting: Gynecology

## 2017-05-06 ENCOUNTER — Other Ambulatory Visit: Payer: Self-pay | Admitting: Gynecology

## 2017-05-06 NOTE — Telephone Encounter (Signed)
She is overdue for her annual exam 

## 2017-06-07 ENCOUNTER — Other Ambulatory Visit: Payer: Self-pay | Admitting: *Deleted

## 2017-06-08 ENCOUNTER — Other Ambulatory Visit: Payer: Self-pay | Admitting: Gynecology

## 2017-06-10 ENCOUNTER — Encounter: Payer: No Typology Code available for payment source | Admitting: Gynecology

## 2017-06-17 ENCOUNTER — Ambulatory Visit (INDEPENDENT_AMBULATORY_CARE_PROVIDER_SITE_OTHER): Payer: BLUE CROSS/BLUE SHIELD | Admitting: Gynecology

## 2017-06-17 ENCOUNTER — Encounter: Payer: Self-pay | Admitting: Gynecology

## 2017-06-17 VITALS — BP 136/88 | Wt 142.6 lb

## 2017-06-17 DIAGNOSIS — L292 Pruritus vulvae: Secondary | ICD-10-CM | POA: Diagnosis not present

## 2017-06-17 DIAGNOSIS — R350 Frequency of micturition: Secondary | ICD-10-CM | POA: Diagnosis not present

## 2017-06-17 LAB — URINALYSIS W MICROSCOPIC + REFLEX CULTURE
Bilirubin Urine: NEGATIVE
CRYSTALS: NONE SEEN [HPF]
Casts: NONE SEEN [LPF]
GLUCOSE, UA: NEGATIVE
LEUKOCYTES UA: NEGATIVE
Nitrite: NEGATIVE
PROTEIN: NEGATIVE
RBC / HPF: NONE SEEN RBC/HPF (ref <=2)
Specific Gravity, Urine: 1.025 (ref 1.001–1.035)
WBC, UA: NONE SEEN WBC/HPF (ref <=5)
YEAST: NONE SEEN [HPF]
pH: 5.5 (ref 5.0–8.0)

## 2017-06-17 LAB — WET PREP FOR TRICH, YEAST, CLUE
Trich, Wet Prep: NONE SEEN
WBC, Wet Prep HPF POC: NONE SEEN
Yeast Wet Prep HPF POC: NONE SEEN

## 2017-06-17 MED ORDER — ESTRADIOL 0.0375 MG/24HR TD PTTW
1.0000 | MEDICATED_PATCH | TRANSDERMAL | 11 refills | Status: DC
Start: 2017-06-17 — End: 2017-09-24

## 2017-06-17 MED ORDER — TINIDAZOLE 500 MG PO TABS: ORAL_TABLET | ORAL | 0 refills | Status: DC

## 2017-06-17 NOTE — Patient Instructions (Signed)
Tinidazole tablets What is this medicine? TINIDAZOLE (tye NI da zole) is an antiinfective. It is used to treat amebiasis, giardiasis, trichomoniasis, and vaginosis. It will not work for colds, flu, or other viral infections. This medicine may be used for other purposes; ask your health care provider or pharmacist if you have questions. COMMON BRAND NAME(S): Tindamax What should I tell my health care provider before I take this medicine? They need to know if you have any of these conditions: -anemia or other blood disorders -if you frequently drink alcohol containing drinks -receiving hemodialysis -seizure disorder -an unusual or allergic reaction to tinidazole, other medicines, foods, dyes, or preservatives -pregnant or trying to get pregnant -breast-feeding How should I use this medicine? Take this medicine by mouth with a full glass of water. Follow the directions on the prescription label. Take with food. Take your medicine at regular intervals. Do not take your medicine more often than directed. Take all of your medicine as directed even if you think you are better. Do not skip doses or stop your medicine early. Talk to your pediatrician regarding the use of this medicine in children. While this drug may be prescribed for children as young as 3 years of age for selected conditions, precautions do apply. Overdosage: If you think you have taken too much of this medicine contact a poison control center or emergency room at once. NOTE: This medicine is only for you. Do not share this medicine with others. What if I miss a dose? If you miss a dose, take it as soon as you can. If it is almost time for your next dose, take only that dose. Do not take double or extra doses. What may interact with this medicine? Do not take this medicine with any of the following medications: -alcohol or any product that contains alcohol -amprenavir oral solution -disulfiram -paclitaxel injection -ritonavir  oral solution -sertraline oral solution -sulfamethoxazole-trimethoprim injection This medicine may also interact with the following medications: -cholestyramine -cimetidine -conivaptan -cyclosporin -fluorouracil -fosphenytoin, phenytoin -ketoconazole -lithium -phenobarbital -tacrolimus -warfarin This list may not describe all possible interactions. Give your health care provider a list of all the medicines, herbs, non-prescription drugs, or dietary supplements you use. Also tell them if you smoke, drink alcohol, or use illegal drugs. Some items may interact with your medicine. What should I watch for while using this medicine? Tell your doctor or health care professional if your symptoms do not improve or if they get worse. Avoid alcoholic drinks while you are taking this medicine and for three days afterward. Alcohol may make you feel dizzy, sick, or flushed. If you are being treated for a sexually transmitted disease, avoid sexual contact until you have finished your treatment. Your sexual partner may also need treatment. What side effects may I notice from receiving this medicine? Side effects that you should report to your doctor or health care professional as soon as possible: -allergic reactions like skin rash, itching or hives, swelling of the face, lips, or tongue -breathing problems -confusion, depression -dark or white patches in the mouth -feeling faint or lightheaded, falls -fever, infection -numbness, tingling, pain or weakness in the hands or feet -pain when passing urine -seizures -unusually weak or tired -vaginal irritation or discharge -vomiting Side effects that usually do not require medical attention (report to your doctor or health care professional if they continue or are bothersome): -dark brown or reddish urine -diarrhea -headache -loss of appetite -metallic taste -nausea -stomach upset This list may not describe all   possible side effects. Call your  doctor for medical advice about side effects. You may report side effects to FDA at 1-800-FDA-1088. Where should I keep my medicine? Keep out of the reach of children. Store at room temperature between 15 and 30 degrees C (59 and 86 degrees F). Protect from light and moisture. Keep container tightly closed. Throw away any unused medicine after the expiration date. NOTE: This sheet is a summary. It may not cover all possible information. If you have questions about this medicine, talk to your doctor, pharmacist, or health care provider.  2018 Elsevier/Gold Standard (2008-07-30 15:22:28) Bacterial Vaginosis Bacterial vaginosis is a vaginal infection that occurs when the normal balance of bacteria in the vagina is disrupted. It results from an overgrowth of certain bacteria. This is the most common vaginal infection among women ages 15-44. Because bacterial vaginosis increases your risk for STIs (sexually transmitted infections), getting treated can help reduce your risk for chlamydia, gonorrhea, herpes, and HIV (human immunodeficiency virus). Treatment is also important for preventing complications in pregnant women, because this condition can cause an early (premature) delivery. What are the causes? This condition is caused by an increase in harmful bacteria that are normally present in small amounts in the vagina. However, the reason that the condition develops is not fully understood. What increases the risk? The following factors may make you more likely to develop this condition:  Having a new sexual partner or multiple sexual partners.  Having unprotected sex.  Douching.  Having an intrauterine device (IUD).  Smoking.  Drug and alcohol abuse.  Taking certain antibiotic medicines.  Being pregnant. You cannot get bacterial vaginosis from toilet seats, bedding, swimming pools, or contact with objects around you. What are the signs or symptoms? Symptoms of this condition  include:  Grey or white vaginal discharge. The discharge can also be watery or foamy.  A fish-like odor with discharge, especially after sexual intercourse or during menstruation.  Itching in and around the vagina.  Burning or pain with urination. Some women with bacterial vaginosis have no signs or symptoms. How is this diagnosed? This condition is diagnosed based on:  Your medical history.  A physical exam of the vagina.  Testing a sample of vaginal fluid under a microscope to look for a large amount of bad bacteria or abnormal cells. Your health care provider may use a cotton swab or a small wooden spatula to collect the sample. How is this treated? This condition is treated with antibiotics. These may be given as a pill, a vaginal cream, or a medicine that is put into the vagina (suppository). If the condition comes back after treatment, a second round of antibiotics may be needed. Follow these instructions at home: Medicines   Take over-the-counter and prescription medicines only as told by your health care provider.  Take or use your antibiotic as told by your health care provider. Do not stop taking or using the antibiotic even if you start to feel better. General instructions   If you have a female sexual partner, tell her that you have a vaginal infection. She should see her health care provider and be treated if she has symptoms. If you have a female sexual partner, he does not need treatment.  During treatment:  Avoid sexual activity until you finish treatment.  Do not douche.  Avoid alcohol as directed by your health care provider.  Avoid breastfeeding as directed by your health care provider.  Drink enough water and fluids to keep your   urine clear or pale yellow.  Keep the area around your vagina and rectum clean.  Wash the area daily with warm water.  Wipe yourself from front to back after using the toilet.  Keep all follow-up visits as told by your health  care provider. This is important. How is this prevented?  Do not douche.  Wash the outside of your vagina with warm water only.  Use protection when having sex. This includes latex condoms and dental dams.  Limit how many sexual partners you have. To help prevent bacterial vaginosis, it is best to have sex with just one partner (monogamous).  Make sure you and your sexual partner are tested for STIs.  Wear cotton or cotton-lined underwear.  Avoid wearing tight pants and pantyhose, especially during summer.  Limit the amount of alcohol that you drink.  Do not use any products that contain nicotine or tobacco, such as cigarettes and e-cigarettes. If you need help quitting, ask your health care provider.  Do not use illegal drugs. Where to find more information:  Centers for Disease Control and Prevention: www.cdc.gov/std  American Sexual Health Association (ASHA): www.ashastd.org  [place] Department of Health and Human Services, Office on Women's Health: www.womenshealth.gov/ or https://www.womenshealth.gov/a-z-topics/bacterial-vaginosis Contact a health care provider if:  Your symptoms do not improve, even after treatment.  You have more discharge or pain when urinating.  You have a fever.  You have pain in your abdomen.  You have pain during sex.  You have vaginal bleeding between periods. Summary  Bacterial vaginosis is a vaginal infection that occurs when the normal balance of bacteria in the vagina is disrupted.  Because bacterial vaginosis increases your risk for STIs (sexually transmitted infections), getting treated can help reduce your risk for chlamydia, gonorrhea, herpes, and HIV (human immunodeficiency virus). Treatment is also important for preventing complications in pregnant women, because the condition can cause an early (premature) delivery.  This condition is treated with antibiotic medicines. These may be given as a pill, a vaginal cream, or a medicine  that is put into the vagina (suppository). This information is not intended to replace advice given to you by your health care provider. Make sure you discuss any questions you have with your health care provider. Document Released: 11/02/2005 Document Revised: 07/18/2016 Document Reviewed: 07/18/2016 Elsevier Interactive Patient Education  2017 Elsevier Inc.  

## 2017-06-17 NOTE — Progress Notes (Signed)
   Patient is a 61 year old that presented to the office with a complaint of vulvar pruritus and some urinary frequency. Patient regarding to the urgent care a week ago and was treated for suspected UTI with Cipro for 5 days she continued to have pruritus and she took over-the-counter 1 day Monistat but continues to have a vaginal odor and irritation. She is in a monogamous relationship.  Exam: Pelvic: Bartholin urethra Skene was within normal limits Vagina: Clear discharge slight fishy odor noted Vaginal cuff: Intact Bimanual exam: Not done Rectal exam not done  Urinalysis clue cells present moderate bacteria  Wet prep many clue cells too numerous to count bacteria cream was noted  Assessment/plan: Clinical evidence of bacterial vaginosis will be treated with Tindamax 500 mg tablets. Patient was to take 4 tabs today repeat in 24 hours.

## 2017-06-18 LAB — URINE CULTURE: Organism ID, Bacteria: NO GROWTH

## 2017-07-27 ENCOUNTER — Encounter: Payer: BLUE CROSS/BLUE SHIELD | Admitting: Obstetrics & Gynecology

## 2017-07-31 DIAGNOSIS — J018 Other acute sinusitis: Secondary | ICD-10-CM | POA: Diagnosis not present

## 2017-09-24 ENCOUNTER — Encounter: Payer: Self-pay | Admitting: Obstetrics & Gynecology

## 2017-09-24 ENCOUNTER — Ambulatory Visit (INDEPENDENT_AMBULATORY_CARE_PROVIDER_SITE_OTHER): Payer: BLUE CROSS/BLUE SHIELD | Admitting: Obstetrics & Gynecology

## 2017-09-24 VITALS — BP 124/82 | Ht 60.0 in | Wt 142.0 lb

## 2017-09-24 DIAGNOSIS — Z23 Encounter for immunization: Secondary | ICD-10-CM | POA: Diagnosis not present

## 2017-09-24 DIAGNOSIS — Z90710 Acquired absence of both cervix and uterus: Secondary | ICD-10-CM

## 2017-09-24 DIAGNOSIS — Z01411 Encounter for gynecological examination (general) (routine) with abnormal findings: Secondary | ICD-10-CM | POA: Diagnosis not present

## 2017-09-24 DIAGNOSIS — Z780 Asymptomatic menopausal state: Secondary | ICD-10-CM | POA: Diagnosis not present

## 2017-09-24 MED ORDER — ESTRADIOL 0.0375 MG/24HR TD PTTW: 1.0000 | MEDICATED_PATCH | TRANSDERMAL | 4 refills | Status: DC

## 2017-09-24 NOTE — Progress Notes (Signed)
MARGERY SZOSTAK 1956/07/10 [id number]   History:    61 y.o. G3P2A1  Married  RP:  Established patient presenting for annual gyn exam   HPI: Status post hysterectomy.  Menopause.  Well on the Vivelle-dot 0.0375.  No pelvic pain.  Sexually active.  Breasts normal.  Urine and bowel movements normal.  Will do fasting labs here.  Past medical history,surgical history, family history and social history were all reviewed and documented in the EPIC chart.  Gynecologic History No LMP recorded. Patient has had a hysterectomy. Contraception: status post hysterectomy Last Pap: 2013. Results were: normal Last mammogram: 2018. Results were: normal Dexa 2015 [place] 2017 Obstetric History OB History  Gravida Para Term Preterm AB Living  _0 SAB TAB Ectopic Multiple Live Births  1            # Outcome Date GA Lbr Len/2nd Weight Sex Delivery Anes PTL Lv  3 SAB           2 Para           1 Para                ROS: A ROS was performed and pertinent positives and negatives are included in the history.  GENERAL: No fevers or chills. HEENT: No change in vision, no earache, sore throat or sinus congestion. NECK: No pain or stiffness. CARDIOVASCULAR: No chest pain or pressure. No palpitations. PULMONARY: No shortness of breath, cough or wheeze. GASTROINTESTINAL: No abdominal pain, nausea, vomiting or diarrhea, melena or bright red blood per rectum. GENITOURINARY: No urinary frequency, urgency, hesitancy or dysuria. MUSCULOSKELETAL: No joint or muscle pain, no back pain, no recent trauma. DERMATOLOGIC: No rash, no itching, no lesions. ENDOCRINE: No polyuria, polydipsia, no heat or cold intolerance. No recent change in weight. HEMATOLOGICAL: No anemia or easy bruising or bleeding. NEUROLOGIC: No headache, seizures, numbness, tingling or weakness. PSYCHIATRIC: No depression, no loss of interest in normal activity or change in sleep pattern.     Exam:   BP 124/82   Ht 5' (1.524 m)   Wt  142 lb (64.4 kg)   BMI 27.73 kg/m   Body mass index is 27.73 kg/m.  General appearance : Well developed well nourished female. No acute distress HEENT: Eyes: no retinal hemorrhage or exudates,  Neck supple, trachea midline, no carotid bruits, no thyroidmegaly Lungs: Clear to auscultation, no rhonchi or wheezes, or rib retractions  Heart: Regular rate and rhythm, no murmurs or gallops Breast:Examined in sitting and supine position were symmetrical in appearance, no palpable masses or tenderness,  no skin retraction, no nipple inversion, no nipple discharge, no skin discoloration, no axillary or supraclavicular lymphadenopathy Abdomen: no palpable masses or tenderness, no rebound or guarding Extremities: no edema or skin discoloration or tenderness  Pelvic: Vulva normal  Bartholin, Urethra, Skene Glands: Within normal limits             Vagina: No gross lesions or discharge.  Pap reflex done.  Cervix/Uterus absent  Adnexa  Without masses or tenderness  Anus and perineum  normal    Assessment/Plan:  61 y.o. female for annual exam   1. Encounter for gynecological examination with abnormal finding Gynecologic exam status post hysterectomy.  Pap reflex done.  Breast exam normal.  Recent mammogram in 2018 normal.  Colonoscopy in 2017.  Will follow up for fasting health labs. - CBC - Comp Met (CMET) - Lipid panel; Future - TSH -  Vitamin D 1,25 dihydroxy  2. History of total hysterectomy  3. Menopause present Well on Vivelle dot 0.0375.  Status post hysterectomy.  Represcribed at same dosage.  Vitamin D supplements, calcium in food and weightbearing physical activity recommended.  Will follow up for a bone density. - DG Bone Density; Future   Princess Bruins MD, 8:23 AM 09/24/2017

## 2017-09-26 NOTE — Patient Instructions (Addendum)
1. Encounter for gynecological examination with abnormal finding Gynecologic exam status post hysterectomy.  Pap reflex done.  Breast exam normal.  Recent mammogram in 2018 normal.  Colonoscopy in 2017.  Will follow up for fasting health labs. - CBC - Comp Met (CMET) - Lipid panel; Future - TSH - Vitamin D 1,25 dihydroxy  2. History of total hysterectomy  3. Menopause present Well on Vivelle dot 0.0375.  Status post hysterectomy.  Represcribed at same dosage.  Vitamin D supplements, calcium in food and weightbearing physical activity recommended.  Will follow up for a bone density. - DG Bone Density; Future  Donna Baker, it was a pleasure meeting you today!  I will inform you of your results as soon as available.   Health Maintenance for Postmenopausal Women Menopause is a normal process in which your reproductive ability comes to an end. This process happens gradually over a span of months to years, usually between the ages of 68 and 109. Menopause is complete when you have missed 12 consecutive menstrual periods. It is important to talk with your health care provider about some of the most common conditions that affect postmenopausal women, such as heart disease, cancer, and bone loss (osteoporosis). Adopting a healthy lifestyle and getting preventive care can help to promote your health and wellness. Those actions can also lower your chances of developing some of these common conditions. What should I know about menopause? During menopause, you may experience a number of symptoms, such as:  Moderate-to-severe hot flashes.  Night sweats.  Decrease in sex drive.  Mood swings.  Headaches.  Tiredness.  Irritability.  Memory problems.  Insomnia.  Choosing to treat or not to treat menopausal changes is an individual decision that you make with your health care provider. What should I know about hormone replacement therapy and supplements? Hormone therapy products are effective for  treating symptoms that are associated with menopause, such as hot flashes and night sweats. Hormone replacement carries certain risks, especially as you become older. If you are thinking about using estrogen or estrogen with progestin treatments, discuss the benefits and risks with your health care provider. What should I know about heart disease and stroke? Heart disease, heart attack, and stroke become more likely as you age. This may be due, in part, to the hormonal changes that your body experiences during menopause. These can affect how your body processes dietary fats, triglycerides, and cholesterol. Heart attack and stroke are both medical emergencies. There are many things that you can do to help prevent heart disease and stroke:  Have your blood pressure checked at least every 1-2 years. High blood pressure causes heart disease and increases the risk of stroke.  If you are 70-34 years old, ask your health care provider if you should take aspirin to prevent a heart attack or a stroke.  Do not use any tobacco products, including cigarettes, chewing tobacco, or electronic cigarettes. If you need help quitting, ask your health care provider.  It is important to eat a healthy diet and maintain a healthy weight. ? Be sure to include plenty of vegetables, fruits, low-fat dairy products, and lean protein. ? Avoid eating foods that are high in solid fats, added sugars, or salt (sodium).  Get regular exercise. This is one of the most important things that you can do for your health. ? Try to exercise for at least 150 minutes each week. The type of exercise that you do should increase your heart rate and make you sweat. This  is known as moderate-intensity exercise. ? Try to do strengthening exercises at least twice each week. Do these in addition to the moderate-intensity exercise.  Know your numbers.Ask your health care provider to check your cholesterol and your blood glucose. Continue to have  your blood tested as directed by your health care provider.  What should I know about cancer screening? There are several types of cancer. Take the following steps to reduce your risk and to catch any cancer development as early as possible. Breast Cancer  Practice breast self-awareness. ? This means understanding how your breasts normally appear and feel. ? It also means doing regular breast self-exams. Let your health care provider know about any changes, no matter how small.  If you are 39 or older, have a clinician do a breast exam (clinical breast exam or CBE) every year. Depending on your age, family history, and medical history, it may be recommended that you also have a yearly breast X-ray (mammogram).  If you have a family history of breast cancer, talk with your health care provider about genetic screening.  If you are at high risk for breast cancer, talk with your health care provider about having an MRI and a mammogram every year.  Breast cancer (BRCA) gene test is recommended for women who have family members with BRCA-related cancers. Results of the assessment will determine the need for genetic counseling and BRCA1 and for BRCA2 testing. BRCA-related cancers include these types: ? Breast. This occurs in males or females. ? Ovarian. ? Tubal. This may also be called fallopian tube cancer. ? Cancer of the abdominal or pelvic lining (peritoneal cancer). ? Prostate. ? Pancreatic.  Cervical, Uterine, and Ovarian Cancer Your health care provider may recommend that you be screened regularly for cancer of the pelvic organs. These include your ovaries, uterus, and vagina. This screening involves a pelvic exam, which includes checking for microscopic changes to the surface of your cervix (Pap test).  For women ages 21-65, health care providers may recommend a pelvic exam and a Pap test every three years. For women ages 46-65, they may recommend the Pap test and pelvic exam, combined  with testing for human papilloma virus (HPV), every five years. Some types of HPV increase your risk of cervical cancer. Testing for HPV may also be done on women of any age who have unclear Pap test results.  Other health care providers may not recommend any screening for nonpregnant women who are considered low risk for pelvic cancer and have no symptoms. Ask your health care provider if a screening pelvic exam is right for you.  If you have had past treatment for cervical cancer or a condition that could lead to cancer, you need Pap tests and screening for cancer for at least 20 years after your treatment. If Pap tests have been discontinued for you, your risk factors (such as having a new sexual partner) need to be reassessed to determine if you should start having screenings again. Some women have medical problems that increase the chance of getting cervical cancer. In these cases, your health care provider may recommend that you have screening and Pap tests more often.  If you have a family history of uterine cancer or ovarian cancer, talk with your health care provider about genetic screening.  If you have vaginal bleeding after reaching menopause, tell your health care provider.  There are currently no reliable tests available to screen for ovarian cancer.  Lung Cancer Lung cancer screening is recommended for  adults 56-59 years old who are at high risk for lung cancer because of a history of smoking. A yearly low-dose CT scan of the lungs is recommended if you:  Currently smoke.  Have a history of at least 30 pack-years of smoking and you currently smoke or have quit within the past 15 years. A pack-year is smoking an average of one pack of cigarettes per day for one year.  Yearly screening should:  Continue until it has been 15 years since you quit.  Stop if you develop a health problem that would prevent you from having lung cancer treatment.  Colorectal Cancer  This type of  cancer can be detected and can often be prevented.  Routine colorectal cancer screening usually begins at age 22 and continues through age 74.  If you have risk factors for colon cancer, your health care provider may recommend that you be screened at an earlier age.  If you have a family history of colorectal cancer, talk with your health care provider about genetic screening.  Your health care provider may also recommend using home test kits to check for hidden blood in your stool.  A small camera at the end of a tube can be used to examine your colon directly (sigmoidoscopy or colonoscopy). This is done to check for the earliest forms of colorectal cancer.  Direct examination of the colon should be repeated every 5-10 years until age 8. However, if early forms of precancerous polyps or small growths are found or if you have a family history or genetic risk for colorectal cancer, you may need to be screened more often.  Skin Cancer  Check your skin from head to toe regularly.  Monitor any moles. Be sure to tell your health care provider: ? About any new moles or changes in moles, especially if there is a change in a mole's shape or color. ? If you have a mole that is larger than the size of a pencil eraser.  If any of your family members has a history of skin cancer, especially at a young age, talk with your health care provider about genetic screening.  Always use sunscreen. Apply sunscreen liberally and repeatedly throughout the day.  Whenever you are outside, protect yourself by wearing long sleeves, pants, a wide-brimmed hat, and sunglasses.  What should I know about osteoporosis? Osteoporosis is a condition in which bone destruction happens more quickly than new bone creation. After menopause, you may be at an increased risk for osteoporosis. To help prevent osteoporosis or the bone fractures that can happen because of osteoporosis, the following is recommended:  If you are  20-78 years old, get at least 1,000 mg of calcium and at least 600 mg of vitamin D per day.  If you are older than age 70 but younger than age 41, get at least 1,200 mg of calcium and at least 600 mg of vitamin D per day.  If you are older than age 26, get at least 1,200 mg of calcium and at least 800 mg of vitamin D per day.  Smoking and excessive alcohol intake increase the risk of osteoporosis. Eat foods that are rich in calcium and vitamin D, and do weight-bearing exercises several times each week as directed by your health care provider. What should I know about how menopause affects my mental health? Depression may occur at any age, but it is more common as you become older. Common symptoms of depression include:  Low or sad mood.  Changes in sleep patterns.  Changes in appetite or eating patterns.  Feeling an overall lack of motivation or enjoyment of activities that you previously enjoyed.  Frequent crying spells.  Talk with your health care provider if you think that you are experiencing depression. What should I know about immunizations? It is important that you get and maintain your immunizations. These include:  Tetanus, diphtheria, and pertussis (Tdap) booster vaccine.  Influenza every year before the flu season begins.  Pneumonia vaccine.  Shingles vaccine.  Your health care provider may also recommend other immunizations. This information is not intended to replace advice given to you by your health care provider. Make sure you discuss any questions you have with your health care provider. Document Released: 12/25/2005 Document Revised: 05/22/2016 Document Reviewed: 08/06/2015 Elsevier Interactive Patient Education  2018 [employer].

## 2017-09-27 ENCOUNTER — Encounter: Payer: Self-pay | Admitting: Obstetrics & Gynecology

## 2017-09-28 ENCOUNTER — Telehealth: Payer: Self-pay | Admitting: *Deleted

## 2017-09-28 ENCOUNTER — Encounter: Payer: Self-pay | Admitting: *Deleted

## 2017-09-28 LAB — PAP IG W/ RFLX HPV ASCU

## 2017-09-28 NOTE — Addendum Note (Signed)
Addended by: Aura CampsWEBB, JENNIFER L on: 09/28/2017 10:51 AM   Modules accepted: Kipp BroodSmartSet

## 2017-09-28 NOTE — Telephone Encounter (Signed)
[facility] Dr. Doran HeaterMarcellino on 10/04/17 @ 12:45pm [phone]. Sent my chart message.

## 2017-09-29 LAB — COMPREHENSIVE METABOLIC PANEL
AG Ratio: 1.7 (calc) (ref 1.0–2.5)
ALBUMIN MSPROF: 4.4 g/dL (ref 3.6–5.1)
ALT: 19 U/L (ref 6–29)
AST: 17 U/L (ref 10–35)
Alkaline phosphatase (APISO): 44 U/L (ref 33–130)
BUN: 15 mg/dL (ref 7–25)
CHLORIDE: 105 mmol/L (ref 98–110)
CO2: 27 mmol/L (ref 20–32)
Calcium: 9.2 mg/dL (ref 8.6–10.4)
Creat: 0.75 mg/dL (ref 0.50–0.99)
GLOBULIN: 2.6 g/dL (ref 1.9–3.7)
GLUCOSE: 104 mg/dL — AB (ref 65–99)
Potassium: 4.4 mmol/L (ref 3.5–5.3)
SODIUM: 138 mmol/L (ref 135–146)
TOTAL PROTEIN: 7 g/dL (ref 6.1–8.1)
Total Bilirubin: 0.4 mg/dL (ref 0.2–1.2)

## 2017-09-29 LAB — TEST AUTHORIZATION

## 2017-09-29 LAB — CBC
HCT: 36.6 % (ref 35.0–45.0)
Hemoglobin: 12.7 g/dL (ref 11.7–15.5)
MCH: 31.4 pg (ref 27.0–33.0)
MCHC: 34.7 g/dL (ref 32.0–36.0)
MCV: 90.4 fL (ref 80.0–100.0)
MPV: 10.1 fL (ref 7.5–12.5)
Platelets: 350 10*3/uL (ref 140–400)
RBC: 4.05 10*6/uL (ref 3.80–5.10)
RDW: 12.8 % (ref 11.0–15.0)
WBC: 6.2 10*3/uL (ref 3.8–10.8)

## 2017-09-29 LAB — LIPID PANEL
CHOL/HDL RATIO: 3.7 (calc) (ref <5.0)
Cholesterol: 246 mg/dL — ABNORMAL HIGH (ref <200)
HDL: 66 mg/dL (ref >50)
LDL Cholesterol (Calc): 155 mg/dL (calc) — ABNORMAL HIGH
NON-HDL CHOLESTEROL (CALC): 180 mg/dL — AB (ref <130)
TRIGLYCERIDES: 124 mg/dL (ref <150)

## 2017-09-29 LAB — TSH: TSH: 2.43 mIU/L (ref 0.40–4.50)

## 2017-09-29 LAB — VITAMIN D 1,25 DIHYDROXY
VITAMIN D3 1, 25 (OH): 37 pg/mL
Vitamin D 1, 25 (OH)2 Total: 37 pg/mL (ref 18–72)

## 2017-09-29 NOTE — Telephone Encounter (Signed)
Pt received message.

## 2017-10-01 ENCOUNTER — Encounter: Payer: Self-pay | Admitting: *Deleted

## 2017-10-12 ENCOUNTER — Encounter: Payer: Self-pay | Admitting: Obstetrics & Gynecology

## 2017-10-12 ENCOUNTER — Other Ambulatory Visit: Payer: Self-pay | Admitting: Otolaryngology

## 2017-10-12 DIAGNOSIS — H903 Sensorineural hearing loss, bilateral: Secondary | ICD-10-CM

## 2017-10-12 DIAGNOSIS — H905 Unspecified sensorineural hearing loss: Secondary | ICD-10-CM

## 2017-10-12 DIAGNOSIS — H9313 Tinnitus, bilateral: Secondary | ICD-10-CM

## 2017-10-12 NOTE — Telephone Encounter (Signed)
Dr. Seymour BarsLavoie, I will let her know your recommendations about glucose. Please advise what to recommend to her about lipid profile results. Thanks!

## 2017-10-14 ENCOUNTER — Other Ambulatory Visit: Payer: Self-pay | Admitting: Obstetrics & Gynecology

## 2017-10-14 DIAGNOSIS — R7309 Other abnormal glucose: Secondary | ICD-10-CM

## 2017-10-14 DIAGNOSIS — E7800 Pure hypercholesterolemia, unspecified: Secondary | ICD-10-CM

## 2017-11-17 ENCOUNTER — Other Ambulatory Visit: Payer: BLUE CROSS/BLUE SHIELD

## 2017-11-24 ENCOUNTER — Ambulatory Visit
Admission: RE | Admit: 2017-11-24 | Discharge: 2017-11-24 | Disposition: A | Discharge status: Home / Self Care | Payer: BLUE CROSS/BLUE SHIELD | Source: Ambulatory Visit | Attending: Otolaryngology | Admitting: Otolaryngology

## 2017-11-24 DIAGNOSIS — H903 Sensorineural hearing loss, bilateral: Secondary | ICD-10-CM

## 2017-11-24 DIAGNOSIS — H9313 Tinnitus, bilateral: Secondary | ICD-10-CM

## 2017-11-24 DIAGNOSIS — H905 Unspecified sensorineural hearing loss: Secondary | ICD-10-CM

## 2017-11-24 MED ORDER — GADOBENATE DIMEGLUMINE 529 MG/ML IV SOLN
12.0000 mL | Freq: Once | INTRAVENOUS | Status: AC | PRN
  Administered 2017-11-24: 12 mL via INTRAVENOUS

## 2018-01-10 ENCOUNTER — Ambulatory Visit (INDEPENDENT_AMBULATORY_CARE_PROVIDER_SITE_OTHER): Payer: BLUE CROSS/BLUE SHIELD | Admitting: Women's Health

## 2018-01-10 ENCOUNTER — Telehealth: Payer: Self-pay | Admitting: *Deleted

## 2018-01-10 ENCOUNTER — Encounter: Payer: Self-pay | Admitting: Women's Health

## 2018-01-10 VITALS — BP 124/80

## 2018-01-10 DIAGNOSIS — N898 Other specified noninflammatory disorders of vagina: Secondary | ICD-10-CM

## 2018-01-10 DIAGNOSIS — R300 Dysuria: Secondary | ICD-10-CM

## 2018-01-10 LAB — WET PREP FOR TRICH, YEAST, CLUE

## 2018-01-10 MED ORDER — FLUCONAZOLE 150 MG PO TABS: 150.0000 mg | ORAL_TABLET | Freq: Once | ORAL | 1 refills | Status: DC

## 2018-01-10 MED ORDER — TRIAMTERENE-HCTZ 75-50 MG PO TABS: ORAL_TABLET | ORAL | 4 refills | Status: DC

## 2018-01-10 NOTE — Progress Notes (Signed)
62 year old S WF G3 P2 presents with complaint of urinary burning with mild vaginal itching. Was treated for UTI with moxifloxaciin 400 mg twice [place] has [place]. 2003 TAH for fibroids on Vivelle patch, same partner. Denies back pain, abdominal pain, pain at end of stream, nausea, visible vaginal discharge or fever. Uses Maxide occasionally for fluid requesting refill. 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  Exam: Appears well. No CVAT. Abdomen soft without rebound or radiation, as external genitalia mild erythema at introitus, wet prep done with a Q-tip, wet prep negative. UA: Negative leukocytes, no wbc's, no RBCs, 6-10 squamous epithelials, moderate bacteria  Resolved UTI Possible yeast vaginitis  Plan: Diflucan 150 by mouth 1 dose, UTI and yeast prevention discussed. Instructed to call if continued problems or if symptoms persist. Rx sent Maxide given, encouraged to use sparingly. Reviewed importance of increasing plain water and avoiding salty/ processed foods. Travels a lot with work encouraged to increase walking during long flights and trips..Marland Kitchen

## 2018-01-10 NOTE — Telephone Encounter (Signed)
Pt called c/o UTI had medication she bought in [place] asked if okay to take medication, I explained to patient I am not familiar with medication and best to schedule OV with provider, transferred to front desk schedule to provider.

## 2018-01-12 LAB — URINALYSIS, COMPLETE W/RFL CULTURE
BILIRUBIN URINE: NEGATIVE
GLUCOSE, UA: NEGATIVE
HGB URINE DIPSTICK: NEGATIVE
Hyaline Cast: NONE SEEN /LPF
Ketones, ur: NEGATIVE
Leukocyte Esterase: NEGATIVE
NITRITES URINE, INITIAL: NEGATIVE
PROTEIN: NEGATIVE
RBC / HPF: NONE SEEN /HPF (ref 0–2)
Specific Gravity, Urine: 1.02 (ref 1.001–1.03)
WBC, UA: NONE SEEN /HPF (ref 0–5)
pH: 7 (ref 5.0–8.0)

## 2018-01-12 LAB — URINE CULTURE
MICRO NUMBER: 90249406
Result:: NO GROWTH
SPECIMEN QUALITY: ADEQUATE

## 2018-01-12 LAB — CULTURE INDICATED

## 2018-03-22 ENCOUNTER — Other Ambulatory Visit: Payer: Self-pay | Admitting: Obstetrics & Gynecology

## 2018-03-22 DIAGNOSIS — N6310 Unspecified lump in the right breast, unspecified quadrant: Secondary | ICD-10-CM

## 2018-03-28 ENCOUNTER — Ambulatory Visit
Admission: RE | Admit: 2018-03-28 | Discharge: 2018-03-28 | Disposition: A | Discharge status: Home / Self Care | Payer: BLUE CROSS/BLUE SHIELD | Source: Ambulatory Visit | Attending: Obstetrics & Gynecology | Admitting: Obstetrics & Gynecology

## 2018-03-28 DIAGNOSIS — N6310 Unspecified lump in the right breast, unspecified quadrant: Secondary | ICD-10-CM

## 2018-03-28 DIAGNOSIS — N6489 Other specified disorders of breast: Secondary | ICD-10-CM | POA: Diagnosis not present

## 2018-03-28 DIAGNOSIS — R928 Other abnormal and inconclusive findings on diagnostic imaging of breast: Secondary | ICD-10-CM | POA: Diagnosis not present

## 2018-04-25 ENCOUNTER — Encounter: Payer: Self-pay | Admitting: Obstetrics & Gynecology

## 2018-05-06 ENCOUNTER — Emergency Department (HOSPITAL_BASED_OUTPATIENT_CLINIC_OR_DEPARTMENT_OTHER): Payer: BLUE CROSS/BLUE SHIELD

## 2018-05-06 ENCOUNTER — Encounter (HOSPITAL_BASED_OUTPATIENT_CLINIC_OR_DEPARTMENT_OTHER): Payer: Self-pay

## 2018-05-06 ENCOUNTER — Emergency Department (HOSPITAL_BASED_OUTPATIENT_CLINIC_OR_DEPARTMENT_OTHER)
Admission: EM | Admit: 2018-05-06 | Discharge: 2018-05-06 | Disposition: A | Discharge status: Home / Self Care | Payer: BLUE CROSS/BLUE SHIELD | Attending: Emergency Medicine | Admitting: Emergency Medicine

## 2018-05-06 ENCOUNTER — Other Ambulatory Visit: Payer: Self-pay

## 2018-05-06 DIAGNOSIS — R079 Chest pain, unspecified: Secondary | ICD-10-CM | POA: Diagnosis not present

## 2018-05-06 DIAGNOSIS — Z79899 Other long term (current) drug therapy: Secondary | ICD-10-CM | POA: Diagnosis not present

## 2018-05-06 DIAGNOSIS — Z87891 Personal history of nicotine dependence: Secondary | ICD-10-CM | POA: Insufficient documentation

## 2018-05-06 DIAGNOSIS — S79911A Unspecified injury of right hip, initial encounter: Secondary | ICD-10-CM | POA: Diagnosis not present

## 2018-05-06 DIAGNOSIS — I959 Hypotension, unspecified: Secondary | ICD-10-CM | POA: Diagnosis not present

## 2018-05-06 DIAGNOSIS — R55 Syncope and collapse: Secondary | ICD-10-CM | POA: Diagnosis not present

## 2018-05-06 DIAGNOSIS — R51 Headache: Secondary | ICD-10-CM | POA: Diagnosis not present

## 2018-05-06 DIAGNOSIS — M545 Low back pain: Secondary | ICD-10-CM | POA: Diagnosis not present

## 2018-05-06 DIAGNOSIS — S069X9A Unspecified intracranial injury with loss of consciousness of unspecified duration, initial encounter: Secondary | ICD-10-CM | POA: Diagnosis not present

## 2018-05-06 DIAGNOSIS — Y999 Unspecified external cause status: Secondary | ICD-10-CM | POA: Diagnosis not present

## 2018-05-06 DIAGNOSIS — M25561 Pain in right knee: Secondary | ICD-10-CM | POA: Diagnosis not present

## 2018-05-06 DIAGNOSIS — M25551 Pain in right hip: Secondary | ICD-10-CM | POA: Diagnosis not present

## 2018-05-06 DIAGNOSIS — R404 Transient alteration of awareness: Secondary | ICD-10-CM | POA: Diagnosis not present

## 2018-05-06 DIAGNOSIS — Y929 Unspecified place or not applicable: Secondary | ICD-10-CM | POA: Diagnosis not present

## 2018-05-06 DIAGNOSIS — S8001XA Contusion of right knee, initial encounter: Secondary | ICD-10-CM | POA: Diagnosis not present

## 2018-05-06 DIAGNOSIS — T148XXA Other injury of unspecified body region, initial encounter: Secondary | ICD-10-CM | POA: Diagnosis not present

## 2018-05-06 DIAGNOSIS — Y9301 Activity, walking, marching and hiking: Secondary | ICD-10-CM | POA: Diagnosis not present

## 2018-05-06 DIAGNOSIS — R52 Pain, unspecified: Secondary | ICD-10-CM | POA: Diagnosis not present

## 2018-05-06 DIAGNOSIS — W19XXXA Unspecified fall, initial encounter: Secondary | ICD-10-CM | POA: Diagnosis not present

## 2018-05-06 LAB — CBC
HCT: 40.7 % (ref 36.0–46.0)
HEMOGLOBIN: 14.1 g/dL (ref 12.0–15.0)
MCH: 31.8 pg (ref 26.0–34.0)
MCHC: 34.6 g/dL (ref 30.0–36.0)
MCV: 91.9 fL (ref 78.0–100.0)
PLATELETS: 326 10*3/uL (ref 150–400)
RBC: 4.43 MIL/uL (ref 3.87–5.11)
RDW: 13.8 % (ref 11.5–15.5)
WBC: 9 10*3/uL (ref 4.0–10.5)

## 2018-05-06 LAB — BASIC METABOLIC PANEL
Anion gap: 11 (ref 5–15)
BUN: 18 mg/dL (ref 6–20)
CHLORIDE: 99 mmol/L — AB (ref 101–111)
CO2: 28 mmol/L (ref 22–32)
CREATININE: 0.91 mg/dL (ref 0.44–1.00)
Calcium: 9.8 mg/dL (ref 8.9–10.3)
Glucose, Bld: 135 mg/dL — ABNORMAL HIGH (ref 65–99)
POTASSIUM: 3.3 mmol/L — AB (ref 3.5–5.1)
SODIUM: 138 mmol/L (ref 135–145)

## 2018-05-06 LAB — TROPONIN I

## 2018-05-06 MED ORDER — METHOCARBAMOL 500 MG PO TABS: 500.0000 mg | ORAL_TABLET | Freq: Every evening | ORAL | 0 refills | Status: DC | PRN

## 2018-05-06 MED ORDER — KETOROLAC TROMETHAMINE 15 MG/ML IJ SOLN
15.0000 mg | Freq: Once | INTRAMUSCULAR | Status: AC
  Administered 2018-05-06: 15 mg via INTRAVENOUS
  Filled 2018-05-06: qty 1

## 2018-05-06 MED ORDER — HYDROCODONE-ACETAMINOPHEN 5-325 MG PO TABS: 1.0000 | ORAL_TABLET | Freq: Four times a day (QID) | ORAL | 0 refills | Status: DC | PRN

## 2018-05-06 MED ORDER — SODIUM CHLORIDE 0.9 % IV BOLUS
1000.0000 mL | Freq: Once | INTRAVENOUS | Status: AC
  Administered 2018-05-06: 1000 mL via INTRAVENOUS

## 2018-05-06 NOTE — ED Notes (Signed)
Patient ambulated in hallway with assistance from spouse; nad noted.

## 2018-05-06 NOTE — ED Notes (Signed)
Pt lying flat for 10 minutes. 
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
## 2018-05-06 NOTE — ED Notes (Signed)
ED Provider at bedside. 

## 2018-05-06 NOTE — ED Notes (Signed)
Pt on monitor 
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
## 2018-05-06 NOTE — Discharge Instructions (Signed)
Take ibuprofen 3 times a day with meals. You may take up to 800 mg (4 pills) at a time.  Do not take other anti-inflammatories at the same time open (Advil, Motrin, naproxen, Aleve). You may supplement with Tylenol if you need further pain control. Use norco as needed for severe pain. Do not drive while taking this medication.  Use Flexeril as needed for muscle stiffness or soreness.  Have caution, this may make you tired or groggy.  Do not drive or operate heavy machinery while taking this medicine. Use ice packs or heating pads if this helps control your pain. You will likely have continued muscle stiffness and soreness over the next couple days.  Follow-up with primary care and/or orthopedics in 1 week if your symptoms are not improving. Return to the emergency room if you develop vision changes, vomiting, slurred speech, numbness, loss of bowel or bladder control, or any new or worsening symptoms.

## 2018-05-06 NOTE — ED Notes (Signed)
Pt ambulated in hall without difficulty. Husband assisted. RN Sharyl Nimrod(Meredith) notified.

## 2018-05-09 DIAGNOSIS — M25561 Pain in right knee: Secondary | ICD-10-CM | POA: Diagnosis not present

## 2018-05-10 DIAGNOSIS — M25561 Pain in right knee: Secondary | ICD-10-CM | POA: Diagnosis not present

## 2018-05-13 DIAGNOSIS — M25561 Pain in right knee: Secondary | ICD-10-CM | POA: Diagnosis not present

## 2018-05-26 DIAGNOSIS — G8918 Other acute postprocedural pain: Secondary | ICD-10-CM | POA: Diagnosis not present

## 2018-05-26 DIAGNOSIS — S83281A Other tear of lateral meniscus, current injury, right knee, initial encounter: Secondary | ICD-10-CM | POA: Diagnosis not present

## 2018-05-26 DIAGNOSIS — X58XXXA Exposure to other specified factors, initial encounter: Secondary | ICD-10-CM | POA: Diagnosis not present

## 2018-05-26 DIAGNOSIS — S83261A Peripheral tear of lateral meniscus, current injury, right knee, initial encounter: Secondary | ICD-10-CM | POA: Diagnosis not present

## 2018-05-26 DIAGNOSIS — Y999 Unspecified external cause status: Secondary | ICD-10-CM | POA: Diagnosis not present

## 2018-05-26 DIAGNOSIS — S83511A Sprain of anterior cruciate ligament of right knee, initial encounter: Secondary | ICD-10-CM | POA: Diagnosis not present

## 2018-05-26 HISTORY — PX: KNEE SURGERY: SHX244

## 2018-06-06 DIAGNOSIS — S83281D Other tear of lateral meniscus, current injury, right knee, subsequent encounter: Secondary | ICD-10-CM | POA: Diagnosis not present

## 2018-06-07 DIAGNOSIS — S83511D Sprain of anterior cruciate ligament of right knee, subsequent encounter: Secondary | ICD-10-CM | POA: Diagnosis not present

## 2018-06-07 DIAGNOSIS — M23251 Derangement of posterior horn of lateral meniscus due to old tear or injury, right knee: Secondary | ICD-10-CM | POA: Diagnosis not present

## 2018-06-07 DIAGNOSIS — M6281 Muscle weakness (generalized): Secondary | ICD-10-CM | POA: Diagnosis not present

## 2018-06-07 DIAGNOSIS — M25612 Stiffness of left shoulder, not elsewhere classified: Secondary | ICD-10-CM | POA: Diagnosis not present

## 2018-06-10 DIAGNOSIS — M23251 Derangement of posterior horn of lateral meniscus due to old tear or injury, right knee: Secondary | ICD-10-CM | POA: Diagnosis not present

## 2018-06-10 DIAGNOSIS — S83511D Sprain of anterior cruciate ligament of right knee, subsequent encounter: Secondary | ICD-10-CM | POA: Diagnosis not present

## 2018-06-10 DIAGNOSIS — S83411D Sprain of medial collateral ligament of right knee, subsequent encounter: Secondary | ICD-10-CM | POA: Diagnosis not present

## 2018-06-10 DIAGNOSIS — M6281 Muscle weakness (generalized): Secondary | ICD-10-CM | POA: Diagnosis not present

## 2018-06-14 DIAGNOSIS — S83511D Sprain of anterior cruciate ligament of right knee, subsequent encounter: Secondary | ICD-10-CM | POA: Diagnosis not present

## 2018-06-14 DIAGNOSIS — S83411D Sprain of medial collateral ligament of right knee, subsequent encounter: Secondary | ICD-10-CM | POA: Diagnosis not present

## 2018-06-14 DIAGNOSIS — M6281 Muscle weakness (generalized): Secondary | ICD-10-CM | POA: Diagnosis not present

## 2018-06-14 DIAGNOSIS — M23251 Derangement of posterior horn of lateral meniscus due to old tear or injury, right knee: Secondary | ICD-10-CM | POA: Diagnosis not present

## 2018-06-20 DIAGNOSIS — M6281 Muscle weakness (generalized): Secondary | ICD-10-CM | POA: Diagnosis not present

## 2018-06-20 DIAGNOSIS — M23251 Derangement of posterior horn of lateral meniscus due to old tear or injury, right knee: Secondary | ICD-10-CM | POA: Diagnosis not present

## 2018-06-20 DIAGNOSIS — S83511D Sprain of anterior cruciate ligament of right knee, subsequent encounter: Secondary | ICD-10-CM | POA: Diagnosis not present

## 2018-06-20 DIAGNOSIS — S83411D Sprain of medial collateral ligament of right knee, subsequent encounter: Secondary | ICD-10-CM | POA: Diagnosis not present

## 2018-06-23 DIAGNOSIS — M23251 Derangement of posterior horn of lateral meniscus due to old tear or injury, right knee: Secondary | ICD-10-CM | POA: Diagnosis not present

## 2018-06-23 DIAGNOSIS — M6281 Muscle weakness (generalized): Secondary | ICD-10-CM | POA: Diagnosis not present

## 2018-06-23 DIAGNOSIS — S83411D Sprain of medial collateral ligament of right knee, subsequent encounter: Secondary | ICD-10-CM | POA: Diagnosis not present

## 2018-06-23 DIAGNOSIS — S83511D Sprain of anterior cruciate ligament of right knee, subsequent encounter: Secondary | ICD-10-CM | POA: Diagnosis not present

## 2018-06-27 DIAGNOSIS — S83511D Sprain of anterior cruciate ligament of right knee, subsequent encounter: Secondary | ICD-10-CM | POA: Diagnosis not present

## 2018-06-27 DIAGNOSIS — M6281 Muscle weakness (generalized): Secondary | ICD-10-CM | POA: Diagnosis not present

## 2018-06-27 DIAGNOSIS — S83411D Sprain of medial collateral ligament of right knee, subsequent encounter: Secondary | ICD-10-CM | POA: Diagnosis not present

## 2018-06-27 DIAGNOSIS — M23251 Derangement of posterior horn of lateral meniscus due to old tear or injury, right knee: Secondary | ICD-10-CM | POA: Diagnosis not present

## 2018-06-29 DIAGNOSIS — M23251 Derangement of posterior horn of lateral meniscus due to old tear or injury, right knee: Secondary | ICD-10-CM | POA: Diagnosis not present

## 2018-06-29 DIAGNOSIS — M6281 Muscle weakness (generalized): Secondary | ICD-10-CM | POA: Diagnosis not present

## 2018-06-29 DIAGNOSIS — S83511D Sprain of anterior cruciate ligament of right knee, subsequent encounter: Secondary | ICD-10-CM | POA: Diagnosis not present

## 2018-06-29 DIAGNOSIS — S83411D Sprain of medial collateral ligament of right knee, subsequent encounter: Secondary | ICD-10-CM | POA: Diagnosis not present
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
## 2018-07-11 DIAGNOSIS — S83511D Sprain of anterior cruciate ligament of right knee, subsequent encounter: Secondary | ICD-10-CM | POA: Diagnosis not present

## 2018-07-11 DIAGNOSIS — S83411D Sprain of medial collateral ligament of right knee, subsequent encounter: Secondary | ICD-10-CM | POA: Diagnosis not present

## 2018-07-11 DIAGNOSIS — M23251 Derangement of posterior horn of lateral meniscus due to old tear or injury, right knee: Secondary | ICD-10-CM | POA: Diagnosis not present

## 2018-07-11 DIAGNOSIS — M6281 Muscle weakness (generalized): Secondary | ICD-10-CM | POA: Diagnosis not present

## 2018-07-14 DIAGNOSIS — M23251 Derangement of posterior horn of lateral meniscus due to old tear or injury, right knee: Secondary | ICD-10-CM | POA: Diagnosis not present

## 2018-07-14 DIAGNOSIS — M6281 Muscle weakness (generalized): Secondary | ICD-10-CM | POA: Diagnosis not present

## 2018-07-14 DIAGNOSIS — S83411D Sprain of medial collateral ligament of right knee, subsequent encounter: Secondary | ICD-10-CM | POA: Diagnosis not present

## 2018-07-14 DIAGNOSIS — S83511D Sprain of anterior cruciate ligament of right knee, subsequent encounter: Secondary | ICD-10-CM | POA: Diagnosis not present

## 2018-07-19 DIAGNOSIS — S83511D Sprain of anterior cruciate ligament of right knee, subsequent encounter: Secondary | ICD-10-CM | POA: Diagnosis not present

## 2018-07-19 DIAGNOSIS — S83411D Sprain of medial collateral ligament of right knee, subsequent encounter: Secondary | ICD-10-CM | POA: Diagnosis not present

## 2018-07-19 DIAGNOSIS — M6281 Muscle weakness (generalized): Secondary | ICD-10-CM | POA: Diagnosis not present

## 2018-07-19 DIAGNOSIS — M23251 Derangement of posterior horn of lateral meniscus due to old tear or injury, right knee: Secondary | ICD-10-CM | POA: Diagnosis not present

## 2018-07-22 DIAGNOSIS — M6281 Muscle weakness (generalized): Secondary | ICD-10-CM | POA: Diagnosis not present

## 2018-07-22 DIAGNOSIS — S83511D Sprain of anterior cruciate ligament of right knee, subsequent encounter: Secondary | ICD-10-CM | POA: Diagnosis not present

## 2018-07-22 DIAGNOSIS — S83411D Sprain of medial collateral ligament of right knee, subsequent encounter: Secondary | ICD-10-CM | POA: Diagnosis not present

## 2018-07-22 DIAGNOSIS — M23251 Derangement of posterior horn of lateral meniscus due to old tear or injury, right knee: Secondary | ICD-10-CM | POA: Diagnosis not present

## 2018-07-26 DIAGNOSIS — M6281 Muscle weakness (generalized): Secondary | ICD-10-CM | POA: Diagnosis not present

## 2018-07-26 DIAGNOSIS — S83411D Sprain of medial collateral ligament of right knee, subsequent encounter: Secondary | ICD-10-CM | POA: Diagnosis not present

## 2018-07-26 DIAGNOSIS — M23251 Derangement of posterior horn of lateral meniscus due to old tear or injury, right knee: Secondary | ICD-10-CM | POA: Diagnosis not present

## 2018-07-26 DIAGNOSIS — S83511D Sprain of anterior cruciate ligament of right knee, subsequent encounter: Secondary | ICD-10-CM | POA: Diagnosis not present

## 2018-07-28 DIAGNOSIS — M6281 Muscle weakness (generalized): Secondary | ICD-10-CM | POA: Diagnosis not present

## 2018-07-28 DIAGNOSIS — S83411D Sprain of medial collateral ligament of right knee, subsequent encounter: Secondary | ICD-10-CM | POA: Diagnosis not present

## 2018-07-28 DIAGNOSIS — S83511D Sprain of anterior cruciate ligament of right knee, subsequent encounter: Secondary | ICD-10-CM | POA: Diagnosis not present

## 2018-07-28 DIAGNOSIS — M23251 Derangement of posterior horn of lateral meniscus due to old tear or injury, right knee: Secondary | ICD-10-CM | POA: Diagnosis not present

## 2018-08-03 ENCOUNTER — Telehealth: Payer: Self-pay | Admitting: *Deleted

## 2018-08-03 ENCOUNTER — Other Ambulatory Visit: Payer: Self-pay | Admitting: Obstetrics & Gynecology

## 2018-08-03 DIAGNOSIS — Z1329 Encounter for screening for other suspected endocrine disorder: Secondary | ICD-10-CM

## 2018-08-03 DIAGNOSIS — M23251 Derangement of posterior horn of lateral meniscus due to old tear or injury, right knee: Secondary | ICD-10-CM | POA: Diagnosis not present

## 2018-08-03 DIAGNOSIS — Z01419 Encounter for gynecological examination (general) (routine) without abnormal findings: Secondary | ICD-10-CM

## 2018-08-03 DIAGNOSIS — Z13220 Encounter for screening for lipoid disorders: Secondary | ICD-10-CM

## 2018-08-03 DIAGNOSIS — S83411D Sprain of medial collateral ligament of right knee, subsequent encounter: Secondary | ICD-10-CM | POA: Diagnosis not present

## 2018-08-03 DIAGNOSIS — S83511D Sprain of anterior cruciate ligament of right knee, subsequent encounter: Secondary | ICD-10-CM | POA: Diagnosis not present

## 2018-08-03 DIAGNOSIS — Z1321 Encounter for screening for nutritional disorder: Secondary | ICD-10-CM

## 2018-08-03 DIAGNOSIS — M6281 Muscle weakness (generalized): Secondary | ICD-10-CM | POA: Diagnosis not present

## 2018-08-03 NOTE — Telephone Encounter (Signed)
Agree, CBC, CMP, FLP, TSH, Vit D.

## 2018-08-03 NOTE — Telephone Encounter (Signed)
Patient has annual scheduled on 10/31/18 would like to have labs done prior to annual exam. Please advise

## 2018-08-04 NOTE — Telephone Encounter (Signed)
Orders placed. Lab appointment scheduled. 

## 2018-08-05 DIAGNOSIS — M23251 Derangement of posterior horn of lateral meniscus due to old tear or injury, right knee: Secondary | ICD-10-CM | POA: Diagnosis not present

## 2018-08-05 DIAGNOSIS — S83411D Sprain of medial collateral ligament of right knee, subsequent encounter: Secondary | ICD-10-CM | POA: Diagnosis not present

## 2018-08-05 DIAGNOSIS — S83511D Sprain of anterior cruciate ligament of right knee, subsequent encounter: Secondary | ICD-10-CM | POA: Diagnosis not present

## 2018-08-05 DIAGNOSIS — M6281 Muscle weakness (generalized): Secondary | ICD-10-CM | POA: Diagnosis not present

## 2018-08-23 DIAGNOSIS — S83411D Sprain of medial collateral ligament of right knee, subsequent encounter: Secondary | ICD-10-CM | POA: Diagnosis not present

## 2018-08-23 DIAGNOSIS — M6281 Muscle weakness (generalized): Secondary | ICD-10-CM | POA: Diagnosis not present

## 2018-08-23 DIAGNOSIS — M23251 Derangement of posterior horn of lateral meniscus due to old tear or injury, right knee: Secondary | ICD-10-CM | POA: Diagnosis not present

## 2018-08-23 DIAGNOSIS — S83511D Sprain of anterior cruciate ligament of right knee, subsequent encounter: Secondary | ICD-10-CM | POA: Diagnosis not present

## 2018-09-05 DIAGNOSIS — M23251 Derangement of posterior horn of lateral meniscus due to old tear or injury, right knee: Secondary | ICD-10-CM | POA: Diagnosis not present

## 2018-09-05 DIAGNOSIS — S83511D Sprain of anterior cruciate ligament of right knee, subsequent encounter: Secondary | ICD-10-CM | POA: Diagnosis not present

## 2018-09-05 DIAGNOSIS — M6281 Muscle weakness (generalized): Secondary | ICD-10-CM | POA: Diagnosis not present

## 2018-09-05 DIAGNOSIS — S83411D Sprain of medial collateral ligament of right knee, subsequent encounter: Secondary | ICD-10-CM | POA: Diagnosis not present

## 2018-09-07 DIAGNOSIS — S83511D Sprain of anterior cruciate ligament of right knee, subsequent encounter: Secondary | ICD-10-CM | POA: Diagnosis not present

## 2018-09-07 DIAGNOSIS — S83411D Sprain of medial collateral ligament of right knee, subsequent encounter: Secondary | ICD-10-CM | POA: Diagnosis not present

## 2018-09-07 DIAGNOSIS — M6281 Muscle weakness (generalized): Secondary | ICD-10-CM | POA: Diagnosis not present

## 2018-09-07 DIAGNOSIS — M23251 Derangement of posterior horn of lateral meniscus due to old tear or injury, right knee: Secondary | ICD-10-CM | POA: Diagnosis not present

## 2018-09-22 DIAGNOSIS — S83511D Sprain of anterior cruciate ligament of right knee, subsequent encounter: Secondary | ICD-10-CM | POA: Diagnosis not present

## 2018-09-22 DIAGNOSIS — M6281 Muscle weakness (generalized): Secondary | ICD-10-CM | POA: Diagnosis not present

## 2018-09-22 DIAGNOSIS — M23251 Derangement of posterior horn of lateral meniscus due to old tear or injury, right knee: Secondary | ICD-10-CM | POA: Diagnosis not present

## 2018-09-22 DIAGNOSIS — S83411D Sprain of medial collateral ligament of right knee, subsequent encounter: Secondary | ICD-10-CM | POA: Diagnosis not present

## 2018-10-18 DIAGNOSIS — K635 Polyp of colon: Secondary | ICD-10-CM | POA: Diagnosis not present

## 2018-10-18 DIAGNOSIS — Z86010 Personal history of colonic polyps: Secondary | ICD-10-CM | POA: Diagnosis not present

## 2018-10-20 ENCOUNTER — Other Ambulatory Visit: Payer: BLUE CROSS/BLUE SHIELD

## 2018-10-21 DIAGNOSIS — K635 Polyp of colon: Secondary | ICD-10-CM | POA: Diagnosis not present

## 2018-10-31 ENCOUNTER — Encounter: Payer: Self-pay | Admitting: Obstetrics & Gynecology

## 2018-10-31 ENCOUNTER — Ambulatory Visit (INDEPENDENT_AMBULATORY_CARE_PROVIDER_SITE_OTHER): Payer: BLUE CROSS/BLUE SHIELD | Admitting: Obstetrics & Gynecology

## 2018-10-31 VITALS — BP 136/88 | Ht 60.0 in | Wt 139.0 lb

## 2018-10-31 DIAGNOSIS — R7309 Other abnormal glucose: Secondary | ICD-10-CM | POA: Diagnosis not present

## 2018-10-31 DIAGNOSIS — Z01419 Encounter for gynecological examination (general) (routine) without abnormal findings: Secondary | ICD-10-CM | POA: Diagnosis not present

## 2018-10-31 DIAGNOSIS — M8589 Other specified disorders of bone density and structure, multiple sites: Secondary | ICD-10-CM | POA: Diagnosis not present

## 2018-10-31 DIAGNOSIS — Z13220 Encounter for screening for lipoid disorders: Secondary | ICD-10-CM | POA: Diagnosis not present

## 2018-10-31 DIAGNOSIS — Z1329 Encounter for screening for other suspected endocrine disorder: Secondary | ICD-10-CM

## 2018-10-31 DIAGNOSIS — Z23 Encounter for immunization: Secondary | ICD-10-CM | POA: Diagnosis not present

## 2018-10-31 DIAGNOSIS — Z79890 Hormone replacement therapy (postmenopausal): Secondary | ICD-10-CM

## 2018-10-31 DIAGNOSIS — Z1321 Encounter for screening for nutritional disorder: Secondary | ICD-10-CM

## 2018-10-31 MED ORDER — ESTRADIOL 0.0375 MG/24HR TD PTTW: 1.0000 | MEDICATED_PATCH | TRANSDERMAL | 4 refills | Status: DC

## 2018-10-31 NOTE — Progress Notes (Signed)
Donna Baker 1956-10-17 [id number]   History:    62 y.o. G3P2A1L2 Boyfriend of 18 years  RP:  Established patient presenting for annual gyn exam   HPI: S/P TAH for Fibroids.  Menopause on Estradiol x 2012.  Well on Vivelle 0.0375 patch.  No pelvic pain.  Urine/BMs wnl.  Breasts normal.  BMI 27.15.  Had a Rt Knee surgery.  Will resume fitness and Low calorie diet now.  Fasting labs here today.  H/O Hypercholesterolemia, not on treatment currently.  Past medical history,surgical history, family history and social history were all reviewed and documented in the EPIC chart.  Gynecologic History No LMP recorded. Patient has had a hysterectomy. Contraception: status post hysterectomy Last Pap: 09/2017. Results were: Negative Last mammogram: 03/2018. Results were: Benign Bone Density: Bone Density 10/2014 Osteopenia  Colonoscopy: 10/2018 Normal per patient  Obstetric History OB History  Gravida Para Term Preterm AB Living  3 2     1 2   SAB TAB Ectopic Multiple Live Births  1            # Outcome Date GA Lbr Len/2nd Weight Sex Delivery Anes PTL Lv  3 SAB           2 Para           1 Para              ROS: A ROS was performed and pertinent positives and negatives are included in the history.  GENERAL: No fevers or chills. HEENT: No change in vision, no earache, sore throat or sinus congestion. NECK: No pain or stiffness. CARDIOVASCULAR: No chest pain or pressure. No palpitations. PULMONARY: No shortness of breath, cough or wheeze. GASTROINTESTINAL: No abdominal pain, nausea, vomiting or diarrhea, melena or bright red blood per rectum. GENITOURINARY: No urinary frequency, urgency, hesitancy or dysuria. MUSCULOSKELETAL: No joint or muscle pain, no back pain, no recent trauma. DERMATOLOGIC: No rash, no itching, no lesions. ENDOCRINE: No polyuria, polydipsia, no heat or cold intolerance. No recent change in weight. HEMATOLOGICAL: No anemia or easy bruising or bleeding. NEUROLOGIC: No  headache, seizures, numbness, tingling or weakness. PSYCHIATRIC: No depression, no loss of interest in normal activity or change in sleep pattern.     Exam:   BP 136/88   Ht 5' (1.524 m)   Wt 139 lb (63 kg)   BMI 27.15 kg/m   Body mass index is 27.15 kg/m.  General appearance : Well developed well nourished female. No acute distress HEENT: Eyes: no retinal hemorrhage or exudates,  Neck supple, trachea midline, no carotid bruits, no thyroidmegaly Lungs: Clear to auscultation, no rhonchi or wheezes, or rib retractions  Heart: Regular rate and rhythm, no murmurs or gallops Breast:Examined in sitting and supine position were symmetrical in appearance, no palpable masses or tenderness,  no skin retraction, no nipple inversion, no nipple discharge, no skin discoloration, no axillary or supraclavicular lymphadenopathy Abdomen: no palpable masses or tenderness, no rebound or guarding Extremities: no edema or skin discoloration or tenderness  Pelvic: Vulva: Normal             Vagina: No gross lesions or discharge  Cervix/Uterus absent  Adnexa  Without masses or tenderness  Anus: Normal   Assessment/Plan:  62 y.o. female for annual exam   1. Well female exam with routine gynecological exam Gynecologic exam status post TAH in menopause.  Pap test was negative in November 2018.  No indication to repeat this year.  Breast exam normal.  Screening mammogram benign in May 2019.  Fasting health labs here today.  If Hypercholesterolemia, will consider treating with Lipitor.  Body mass index 27.15.  Patient is post right knee surgery.  Will start back with regular fitness activities.  Recommend aerobic activities 5 times a week and weightlifting every 2 days.  Lower calorie/carb diet such as [employer] diet.  2. Post-menopause on HRT (hormone replacement therapy) Well on Vivelle-Dot 0.0375.  Patient has been on hormone replacement therapy for 7 to 8 years.  No contraindication to continue at this  time.  Will stop hormone replacement therapy at the latest at age 62.  Prescription sent to pharmacy.  3. Osteopenia of multiple sites Osteopenia on bone density December 2015.  Will repeat bone density now.  Vitamin D supplements, calcium intake of 1.5 g/day and regular weightbearing physical activities recommended. - DG Bone Density; Future  Other orders - estradiol (VIVELLE-DOT) 0.0375 MG/24HR; Place 1 patch onto the skin 2 (two) times a week.  Genia DelMarie-Lyne Suliman Termini MD, 8:23 AM 10/31/2018

## 2018-10-31 NOTE — Patient Instructions (Signed)
1. Well female exam with routine gynecological exam Gynecologic exam status post TAH in menopause.  Pap test was negative in November 2018.  No indication to repeat this year.  Breast exam normal.  Screening mammogram benign in May 2019.  Fasting health labs here today.  If Hypercholesterolemia, will consider treating with Lipitor.  Body mass index 27.15.  Patient is post right knee surgery.  Will start back with regular fitness activities.  Recommend aerobic activities 5 times a week and weightlifting every 2 days.  Lower calorie/carb diet such as [employer] diet.  2. Post-menopause on HRT (hormone replacement therapy) Well on Vivelle-Dot 0.0375.  Patient has been on hormone replacement therapy for 7 to 8 years.  No contraindication to continue at this time.  Will stop hormone replacement therapy at the latest at age 62.  Prescription sent to pharmacy.  3. Osteopenia of multiple sites Osteopenia on bone density December 2015.  Will repeat bone density now.  Vitamin D supplements, calcium intake of 1.5 g/day and regular weightbearing physical activities recommended. - DG Bone Density; Future  Other orders - estradiol (VIVELLE-DOT) 0.0375 MG/24HR; Place 1 patch onto the skin 2 (two) times a week.  Beth, it was a pleasure seeing you today!  I will inform you of your results as soon as they are available.

## 2018-11-01 ENCOUNTER — Other Ambulatory Visit: Payer: Self-pay

## 2018-11-01 ENCOUNTER — Telehealth: Payer: Self-pay

## 2018-11-01 DIAGNOSIS — E559 Vitamin D deficiency, unspecified: Secondary | ICD-10-CM

## 2018-11-01 LAB — COMPREHENSIVE METABOLIC PANEL
AG Ratio: 1.9 (calc) (ref 1.0–2.5)
ALT: 17 U/L (ref 6–29)
AST: 19 U/L (ref 10–35)
Albumin: 5.1 g/dL (ref 3.6–5.1)
Alkaline phosphatase (APISO): 56 U/L (ref 33–130)
BUN: 13 mg/dL (ref 7–25)
CO2: 26 mmol/L (ref 20–32)
Calcium: 10.5 mg/dL — ABNORMAL HIGH (ref 8.6–10.4)
Chloride: 102 mmol/L (ref 98–110)
Creat: 0.78 mg/dL (ref 0.50–0.99)
GLUCOSE: 100 mg/dL — AB (ref 65–99)
Globulin: 2.7 g/dL (calc) (ref 1.9–3.7)
Potassium: 4.4 mmol/L (ref 3.5–5.3)
SODIUM: 138 mmol/L (ref 135–146)
TOTAL PROTEIN: 7.8 g/dL (ref 6.1–8.1)
Total Bilirubin: 0.5 mg/dL (ref 0.2–1.2)

## 2018-11-01 LAB — CBC
HCT: 39.7 % (ref 35.0–45.0)
HEMOGLOBIN: 13.3 g/dL (ref 11.7–15.5)
MCH: 30.9 pg (ref 27.0–33.0)
MCHC: 33.5 g/dL (ref 32.0–36.0)
MCV: 92.3 fL (ref 80.0–100.0)
MPV: 10.4 fL (ref 7.5–12.5)
PLATELETS: 375 10*3/uL (ref 140–400)
RBC: 4.3 10*6/uL (ref 3.80–5.10)
RDW: 13.7 % (ref 11.0–15.0)
WBC: 5.4 10*3/uL (ref 3.8–10.8)

## 2018-11-01 LAB — LIPID PANEL
Cholesterol: 259 mg/dL — ABNORMAL HIGH (ref <200)
HDL: 75 mg/dL (ref >50)
LDL CHOLESTEROL (CALC): 159 mg/dL — AB
NON-HDL CHOLESTEROL (CALC): 184 mg/dL — AB (ref <130)
TRIGLYCERIDES: 123 mg/dL (ref <150)
Total CHOL/HDL Ratio: 3.5 (calc) (ref <5.0)

## 2018-11-01 LAB — HEMOGLOBIN A1C
Hgb A1c MFr Bld: 5.7 % of total Hgb — ABNORMAL HIGH (ref <5.7)
Mean Plasma Glucose: 117 (calc)
eAG (mmol/L): 6.5 (calc)

## 2018-11-01 LAB — TEST AUTHORIZATION

## 2018-11-01 LAB — VITAMIN D 25 HYDROXY (VIT D DEFICIENCY, FRACTURES): VIT D 25 HYDROXY: 15 ng/mL — AB (ref 30–100)

## 2018-11-01 LAB — TSH: TSH: 2.28 m[IU]/L (ref 0.40–4.50)

## 2018-11-01 MED ORDER — VITAMIN D (ERGOCALCIFEROL) 1.25 MG (50000 UNIT) PO CAPS: 50000.0000 [IU] | ORAL_CAPSULE | ORAL | 0 refills | Status: DC

## 2018-11-01 MED ORDER — ATORVASTATIN CALCIUM 10 MG PO TABS: 10.0000 mg | ORAL_TABLET | Freq: Every day | ORAL | 4 refills | Status: DC

## 2018-11-01 NOTE — Telephone Encounter (Signed)
Yes, please start on Lipitor 10 mg 1 tab PO daily.  #90, refill x 4.

## 2018-11-01 NOTE — Telephone Encounter (Signed)
Spoke with patient about results.  She said you told her if FLP was elevated that you would prescribe cholesterol medication for her. I told her you recommended a low cholesterol diet.  She said her cholesterol keeps going up every year and she wonders if she would not benefit from a low dose med for it.    I told her I would run it by you.

## 2018-11-01 NOTE — Telephone Encounter (Signed)
-----   Message from Genia DelMarie-Lyne Lavoie, MD sent at 11/01/2018  7:25 AM EST ----- Vit D very low still.  Vit D protocole [id number] IU weekly x 12.  Repeat Vit D after.  TSH normal.  CBC normal.  CMP Glucose better, borderline at 100.  Recommend HBA1C.  Ca++ 10.5 just above normal, not a significant abnormality.  FLP Total cholesterol/HDL is normal at 3.5.  Low Cholesterol diet.

## 2018-11-02 HISTORY — PX: COLONOSCOPY: SHX174

## 2018-11-23 DIAGNOSIS — M23251 Derangement of posterior horn of lateral meniscus due to old tear or injury, right knee: Secondary | ICD-10-CM | POA: Diagnosis not present

## 2018-11-25 DIAGNOSIS — S83511D Sprain of anterior cruciate ligament of right knee, subsequent encounter: Secondary | ICD-10-CM | POA: Diagnosis not present

## 2018-11-25 DIAGNOSIS — M23251 Derangement of posterior horn of lateral meniscus due to old tear or injury, right knee: Secondary | ICD-10-CM | POA: Diagnosis not present

## 2018-11-25 DIAGNOSIS — S83411D Sprain of medial collateral ligament of right knee, subsequent encounter: Secondary | ICD-10-CM | POA: Diagnosis not present

## 2018-11-25 DIAGNOSIS — M6281 Muscle weakness (generalized): Secondary | ICD-10-CM | POA: Diagnosis not present

## 2018-12-14 DIAGNOSIS — M23251 Derangement of posterior horn of lateral meniscus due to old tear or injury, right knee: Secondary | ICD-10-CM | POA: Diagnosis not present

## 2018-12-14 DIAGNOSIS — M6281 Muscle weakness (generalized): Secondary | ICD-10-CM | POA: Diagnosis not present

## 2018-12-14 DIAGNOSIS — S83411D Sprain of medial collateral ligament of right knee, subsequent encounter: Secondary | ICD-10-CM | POA: Diagnosis not present

## 2018-12-14 DIAGNOSIS — S83511D Sprain of anterior cruciate ligament of right knee, subsequent encounter: Secondary | ICD-10-CM | POA: Diagnosis not present

## 2018-12-21 DIAGNOSIS — S83511D Sprain of anterior cruciate ligament of right knee, subsequent encounter: Secondary | ICD-10-CM | POA: Diagnosis not present

## 2018-12-21 DIAGNOSIS — S83411D Sprain of medial collateral ligament of right knee, subsequent encounter: Secondary | ICD-10-CM | POA: Diagnosis not present

## 2018-12-21 DIAGNOSIS — M23251 Derangement of posterior horn of lateral meniscus due to old tear or injury, right knee: Secondary | ICD-10-CM | POA: Diagnosis not present

## 2018-12-21 DIAGNOSIS — M6281 Muscle weakness (generalized): Secondary | ICD-10-CM | POA: Diagnosis not present

## 2018-12-26 DIAGNOSIS — M23251 Derangement of posterior horn of lateral meniscus due to old tear or injury, right knee: Secondary | ICD-10-CM | POA: Diagnosis not present

## 2018-12-26 DIAGNOSIS — S83511D Sprain of anterior cruciate ligament of right knee, subsequent encounter: Secondary | ICD-10-CM | POA: Diagnosis not present

## 2018-12-26 DIAGNOSIS — M6281 Muscle weakness (generalized): Secondary | ICD-10-CM | POA: Diagnosis not present

## 2018-12-26 DIAGNOSIS — S83411D Sprain of medial collateral ligament of right knee, subsequent encounter: Secondary | ICD-10-CM | POA: Diagnosis not present

## 2019-01-18 DIAGNOSIS — H35372 Puckering of macula, left eye: Secondary | ICD-10-CM | POA: Diagnosis not present

## 2019-04-06 ENCOUNTER — Other Ambulatory Visit: Payer: Self-pay | Admitting: Women's Health

## 2019-04-06 NOTE — Telephone Encounter (Signed)
Donna Baker, you refilled it for her 01/10/18 for #90 with 4 refills. Looks like she has been taking it since 2014 and Dr. Glenetta Hew was always the prescriber previously.

## 2019-04-06 NOTE — Telephone Encounter (Signed)
Dr Seymour Bars pt, but I don't see that we prescribed

## 2019-04-06 NOTE — Telephone Encounter (Signed)
Ok, she does not take daily?  Ok for prn use

## 2019-04-11 ENCOUNTER — Other Ambulatory Visit: Payer: Self-pay | Admitting: Obstetrics & Gynecology

## 2019-04-11 DIAGNOSIS — E7800 Pure hypercholesterolemia, unspecified: Secondary | ICD-10-CM

## 2019-04-11 DIAGNOSIS — E559 Vitamin D deficiency, unspecified: Secondary | ICD-10-CM

## 2019-04-11 NOTE — Telephone Encounter (Signed)
OK for refill.

## 2019-04-11 NOTE — Telephone Encounter (Signed)
Patient only takes it as needed with travel. She said she has not taken any in 2-3 months. She said she takes it very infrequently.

## 2019-04-17 ENCOUNTER — Other Ambulatory Visit: Payer: Self-pay | Admitting: Obstetrics & Gynecology

## 2019-04-17 DIAGNOSIS — Z1231 Encounter for screening mammogram for malignant neoplasm of breast: Secondary | ICD-10-CM

## 2019-04-19 ENCOUNTER — Other Ambulatory Visit: Payer: Self-pay

## 2019-04-19 ENCOUNTER — Other Ambulatory Visit: Payer: BC Managed Care – PPO

## 2019-04-19 DIAGNOSIS — E559 Vitamin D deficiency, unspecified: Secondary | ICD-10-CM

## 2019-04-19 DIAGNOSIS — E7800 Pure hypercholesterolemia, unspecified: Secondary | ICD-10-CM | POA: Diagnosis not present

## 2019-04-20 LAB — LIPID PANEL
Cholesterol: 164 mg/dL (ref <200)
HDL: 69 mg/dL (ref >=50)
LDL Cholesterol (Calc): 75 mg/dL (calc)
Non-HDL Cholesterol (Calc): 95 mg/dL (calc) (ref <130)
Total CHOL/HDL Ratio: 2.4 (calc) (ref <5.0)
Triglycerides: 112 mg/dL (ref <150)

## 2019-04-20 LAB — VITAMIN D 25 HYDROXY (VIT D DEFICIENCY, FRACTURES): Vit D, 25-Hydroxy: 29 ng/mL — ABNORMAL LOW (ref 30–100)

## 2019-06-02 ENCOUNTER — Ambulatory Visit
Admission: RE | Admit: 2019-06-02 | Discharge: 2019-06-02 | Disposition: A | Discharge status: Home / Self Care | Payer: BC Managed Care – PPO | Source: Ambulatory Visit | Attending: Obstetrics & Gynecology | Admitting: Obstetrics & Gynecology

## 2019-06-02 DIAGNOSIS — Z1231 Encounter for screening mammogram for malignant neoplasm of breast: Secondary | ICD-10-CM

## 2019-07-05 ENCOUNTER — Other Ambulatory Visit: Payer: Self-pay | Admitting: Women's Health

## 2019-07-05 NOTE — Telephone Encounter (Signed)
Okay for refill?  

## 2019-07-05 NOTE — Telephone Encounter (Signed)
Per previous refill requesting on 04/06/19 "Patient only takes it as needed with travel"

## 2019-07-29 DIAGNOSIS — Z20828 Contact with and (suspected) exposure to other viral communicable diseases: Secondary | ICD-10-CM | POA: Diagnosis not present

## 2019-09-25 DIAGNOSIS — L239 Allergic contact dermatitis, unspecified cause: Secondary | ICD-10-CM | POA: Diagnosis not present

## 2019-10-14 DIAGNOSIS — Z20828 Contact with and (suspected) exposure to other viral communicable diseases: Secondary | ICD-10-CM | POA: Diagnosis not present

## 2019-10-16 DIAGNOSIS — H01135 Eczematous dermatitis of left lower eyelid: Secondary | ICD-10-CM | POA: Diagnosis not present

## 2019-10-16 DIAGNOSIS — H01134 Eczematous dermatitis of left upper eyelid: Secondary | ICD-10-CM | POA: Diagnosis not present

## 2019-10-16 DIAGNOSIS — H01131 Eczematous dermatitis of right upper eyelid: Secondary | ICD-10-CM | POA: Diagnosis not present

## 2019-10-16 DIAGNOSIS — H01132 Eczematous dermatitis of right lower eyelid: Secondary | ICD-10-CM | POA: Diagnosis not present

## 2019-12-12 ENCOUNTER — Ambulatory Visit: Payer: BC Managed Care – PPO

## 2019-12-13 ENCOUNTER — Other Ambulatory Visit: Payer: Self-pay | Admitting: Obstetrics & Gynecology

## 2019-12-13 NOTE — Telephone Encounter (Signed)
Annual exam scheduled on 01/15/20. Per note on 10/31/18 "If Hypercholesterolemia, will consider treating with Lipitor.

## 2019-12-15 ENCOUNTER — Other Ambulatory Visit: Payer: Self-pay | Admitting: Obstetrics & Gynecology

## 2019-12-21 ENCOUNTER — Ambulatory Visit: Payer: BC Managed Care – PPO

## 2019-12-25 ENCOUNTER — Ambulatory Visit: Payer: Managed Care, Other (non HMO) | Attending: Internal Medicine

## 2019-12-25 DIAGNOSIS — Z23 Encounter for immunization: Secondary | ICD-10-CM | POA: Insufficient documentation

## 2019-12-25 NOTE — Progress Notes (Signed)
   Covid-19 Vaccination [facility]  Name:  [name]    MRN: [id number] DOB: [birth year]  12/25/2019  [name] was observed post Covid-19 immunization for 15 minutes without incidence. She was provided with Vaccine Information Sheet and instruction to access the V-Safe system.   [name] was instructed to call 911 with any severe reactions post vaccine: Marland Kitchen Difficulty breathing  . Swelling of your face and throat  . A fast heartbeat  . A bad rash all over your body  . Dizziness and weakness    Immunizations Administered    Name Date Dose VIS Date Route   Pfizer COVID-19 Vaccine 12/25/2019  9:08 AM 0.3 mL 10/27/2019 Intramuscular   Manufacturer: [employer], [employer]   Lot: [id number]   NDC: [address]-2

## 2019-12-28 ENCOUNTER — Encounter: Payer: BC Managed Care – PPO | Admitting: Obstetrics & Gynecology

## 2019-12-29 ENCOUNTER — Ambulatory Visit: Payer: BC Managed Care – PPO

## 2020-01-15 ENCOUNTER — Ambulatory Visit (INDEPENDENT_AMBULATORY_CARE_PROVIDER_SITE_OTHER): Payer: Managed Care, Other (non HMO) | Admitting: Obstetrics & Gynecology

## 2020-01-15 ENCOUNTER — Encounter: Payer: Self-pay | Admitting: Obstetrics & Gynecology

## 2020-01-15 ENCOUNTER — Other Ambulatory Visit: Payer: Self-pay

## 2020-01-15 VITALS — BP 130/80 | Ht 60.0 in | Wt 140.6 lb

## 2020-01-15 DIAGNOSIS — Z79890 Hormone replacement therapy (postmenopausal): Secondary | ICD-10-CM | POA: Diagnosis not present

## 2020-01-15 DIAGNOSIS — Z01419 Encounter for gynecological examination (general) (routine) without abnormal findings: Secondary | ICD-10-CM | POA: Diagnosis not present

## 2020-01-15 DIAGNOSIS — M8589 Other specified disorders of bone density and structure, multiple sites: Secondary | ICD-10-CM | POA: Diagnosis not present

## 2020-01-15 MED ORDER — ESTRADIOL 0.025 MG/24HR TD PTTW: 1.0000 | MEDICATED_PATCH | TRANSDERMAL | 4 refills | Status: DC

## 2020-01-15 NOTE — Progress Notes (Signed)
Donna Baker January 08, 1956 [id number]   History:    64 y.o. G3P2A1L2 Boyfriend of 19 years  RP:  Established patient presenting for annual gyn exam   HPI: S/P TAH for Fibroids.  Menopause on Estradiol x 08/2012.  Well on Vivelle 0.0375 patch.  No pelvic pain.  Urine/BMs wnl.  Breasts normal.  BMI 27.46.  Will resume fitness and Low calorie diet now.  Fasting labs here today.  H/O Hypercholesterolemia, not on treatment currently.   Past medical history,surgical history, family history and social history were all reviewed and documented in the EPIC chart.  Gynecologic History No LMP recorded. Patient has had a hysterectomy.  Obstetric History OB History  Gravida Para Term Preterm AB Living  '3 2     1 2  ' SAB TAB Ectopic Multiple Live Births  1            # Outcome Date GA Lbr Len/2nd Weight Sex Delivery Anes PTL Lv  3 SAB           2 Para           1 Para              ROS: A ROS was performed and pertinent positives and negatives are included in the history.  GENERAL: No fevers or chills. HEENT: No change in vision, no earache, sore throat or sinus congestion. NECK: No pain or stiffness. CARDIOVASCULAR: No chest pain or pressure. No palpitations. PULMONARY: No shortness of breath, cough or wheeze. GASTROINTESTINAL: No abdominal pain, nausea, vomiting or diarrhea, melena or bright red blood per rectum. GENITOURINARY: No urinary frequency, urgency, hesitancy or dysuria. MUSCULOSKELETAL: No joint or muscle pain, no back pain, no recent trauma. DERMATOLOGIC: No rash, no itching, no lesions. ENDOCRINE: No polyuria, polydipsia, no heat or cold intolerance. No recent change in weight. HEMATOLOGICAL: No anemia or easy bruising or bleeding. NEUROLOGIC: No headache, seizures, numbness, tingling or weakness. PSYCHIATRIC: No depression, no loss of interest in normal activity or change in sleep pattern.     Exam:   BP 130/80   Ht 5' (1.524 m)   Wt 140 lb 9.6 oz (63.8 kg)   BMI 27.46  kg/m   Body mass index is 27.46 kg/m.  General appearance : Well developed well nourished female. No acute distress HEENT: Eyes: no retinal hemorrhage or exudates,  Neck supple, trachea midline, no carotid bruits, no thyroidmegaly Lungs: Clear to auscultation, no rhonchi or wheezes, or rib retractions  Heart: Regular rate and rhythm, no murmurs or gallops Breast:Examined in sitting and supine position were symmetrical in appearance, no palpable masses or tenderness,  no skin retraction, no nipple inversion, no nipple discharge, no skin discoloration, no axillary or supraclavicular lymphadenopathy Abdomen: no palpable masses or tenderness, no rebound or guarding Extremities: no edema or skin discoloration or tenderness  Pelvic: Vulva: Normal             Vagina: No gross lesions or discharge  Cervix/Uterus Absent  Adnexa  Without masses or tenderness  Anus: Normal   Assessment/Plan:  64 y.o. female for annual exam   1. Well woman exam with routine gynecological exam Gynecologic exam status post total abdominal hysterectomy in menopause.  Pap test in November 2018 was negative, no indication to repeat this year.  Breast exam normal.  Screening mammogram July 2020 was negative.  Colonoscopy in 2019.  Fasting health labs here today.  Body mass index 27.46.  Recommend starting back on regular fitness activities with  aerobic activities 5 times a week and light weightlifting every 2 days. - CBC - Comp Met (CMET) - Lipid panel - TSH - VITAMIN D 25 Hydroxy (Vit-D Deficiency, Fractures)  2. Post-menopause on HRT (hormone replacement therapy) Well on hormone replacement therapy since 2013.  No contraindication to continue.  Decision to decrease the dosage to the estradiol patch 0.025 twice weekly.  Prescription sent to pharmacy.  3. Osteopenia of multiple sites Schedule bone density here now.  Vitamin D supplements, calcium intake of 1200 mg daily and regular weightbearing physical activity is  recommended. - DG Bone Density; Future  Other orders - estradiol (VIVELLE-DOT) 0.025 MG/24HR; Place 1 patch onto the skin 2 (two) times a week.  Princess Bruins MD, 8:44 AM 01/15/2020

## 2020-01-15 NOTE — Patient Instructions (Signed)
1. Well woman exam with routine gynecological exam Gynecologic exam status post total abdominal hysterectomy in menopause.  Pap test in November 2018 was negative, no indication to repeat this year.  Breast exam normal.  Screening mammogram July 2020 was negative.  Colonoscopy in 2019.  Fasting health labs here today.  Body mass index 27.46.  Recommend starting back on regular fitness activities with aerobic activities 5 times a week and light weightlifting every 2 days. - CBC - Comp Met (CMET) - Lipid panel - TSH - VITAMIN D 25 Hydroxy (Vit-D Deficiency, Fractures)  2. Post-menopause on HRT (hormone replacement therapy) Well on hormone replacement therapy since 2013.  No contraindication to continue.  Decision to decrease the dosage to the estradiol patch 0.025 twice weekly.  Prescription sent to pharmacy.  3. Osteopenia of multiple sites Schedule bone density here now.  Vitamin D supplements, calcium intake of 1200 mg daily and regular weightbearing physical activity is recommended. - DG Bone Density; Future  Other orders - estradiol (VIVELLE-DOT) 0.025 MG/24HR; Place 1 patch onto the skin 2 (two) times a week.  Donna Baker, it was a pleasure seeing you today!  I will inform you of your results as soon as they are available.

## 2020-01-16 LAB — COMPREHENSIVE METABOLIC PANEL
AG Ratio: 1.8 (calc) (ref 1.0–2.5)
ALT: 16 U/L (ref 6–29)
AST: 18 U/L (ref 10–35)
Albumin: 4.9 g/dL (ref 3.6–5.1)
Alkaline phosphatase (APISO): 63 U/L (ref 37–153)
BUN: 15 mg/dL (ref 7–25)
CO2: 25 mmol/L (ref 20–32)
Calcium: 10.1 mg/dL (ref 8.6–10.4)
Chloride: 102 mmol/L (ref 98–110)
Creat: 0.71 mg/dL (ref 0.50–0.99)
Globulin: 2.7 g/dL (calc) (ref 1.9–3.7)
Glucose, Bld: 110 mg/dL — ABNORMAL HIGH (ref 65–99)
Potassium: 4.5 mmol/L (ref 3.5–5.3)
Sodium: 138 mmol/L (ref 135–146)
Total Bilirubin: 0.5 mg/dL (ref 0.2–1.2)
Total Protein: 7.6 g/dL (ref 6.1–8.1)

## 2020-01-16 LAB — TSH: TSH: 3 mIU/L (ref 0.40–4.50)

## 2020-01-16 LAB — CBC
HCT: 38.4 % (ref 35.0–45.0)
Hemoglobin: 13.1 g/dL (ref 11.7–15.5)
MCH: 31.6 pg (ref 27.0–33.0)
MCHC: 34.1 g/dL (ref 32.0–36.0)
MCV: 92.5 fL (ref 80.0–100.0)
MPV: 10.4 fL (ref 7.5–12.5)
Platelets: 323 10*3/uL (ref 140–400)
RBC: 4.15 10*6/uL (ref 3.80–5.10)
RDW: 12.8 % (ref 11.0–15.0)
WBC: 6.2 10*3/uL (ref 3.8–10.8)

## 2020-01-16 LAB — LIPID PANEL
Cholesterol: 185 mg/dL (ref <200)
HDL: 60 mg/dL (ref >=50)
LDL Cholesterol (Calc): 100 mg/dL (calc) — ABNORMAL HIGH
Non-HDL Cholesterol (Calc): 125 mg/dL (calc) (ref <130)
Total CHOL/HDL Ratio: 3.1 (calc) (ref <5.0)
Triglycerides: 156 mg/dL — ABNORMAL HIGH (ref <150)

## 2020-01-16 LAB — VITAMIN D 25 HYDROXY (VIT D DEFICIENCY, FRACTURES): Vit D, 25-Hydroxy: 18 ng/mL — ABNORMAL LOW (ref 30–100)

## 2020-01-18 ENCOUNTER — Ambulatory Visit: Payer: Managed Care, Other (non HMO) | Attending: Internal Medicine

## 2020-01-18 DIAGNOSIS — Z23 Encounter for immunization: Secondary | ICD-10-CM | POA: Insufficient documentation

## 2020-01-18 NOTE — Progress Notes (Signed)
   Covid-19 Vaccination Clinic  Name:  Donna Baker    MRN: [id number] DOB: 12/12/1955  01/18/2020  [name] was observed post Covid-19 immunization for 15 minutes without incident. She was provided with Vaccine Information Sheet and instruction to access the V-Safe system.   [name] was instructed to call 911 with any severe reactions post vaccine: Marland Kitchen Difficulty breathing  . Swelling of face and throat  . A fast heartbeat  . A bad rash all over body  . Dizziness and weakness   Immunizations Administered    Name Date Dose VIS Date Route   Pfizer COVID-19 Vaccine 01/18/2020  4:18 PM 0.3 mL 10/27/2019 Intramuscular   Manufacturer: [employer], [employer]   Lot: [id number]   NDC: [address]-3

## 2020-01-22 ENCOUNTER — Other Ambulatory Visit: Payer: Self-pay

## 2020-01-22 DIAGNOSIS — E559 Vitamin D deficiency, unspecified: Secondary | ICD-10-CM

## 2020-01-22 MED ORDER — VITAMIN D (ERGOCALCIFEROL) 1.25 MG (50000 UNIT) PO CAPS: 50000.0000 [IU] | ORAL_CAPSULE | ORAL | 0 refills | Status: DC

## 2020-01-23 ENCOUNTER — Other Ambulatory Visit: Payer: Self-pay

## 2020-01-23 DIAGNOSIS — R7309 Other abnormal glucose: Secondary | ICD-10-CM

## 2020-01-23 NOTE — Telephone Encounter (Signed)
Patient is requesting to recheck her lipid profile in 3 mos as well. Ok to order?

## 2020-01-26 ENCOUNTER — Other Ambulatory Visit: Payer: Self-pay

## 2020-01-26 DIAGNOSIS — E7800 Pure hypercholesterolemia, unspecified: Secondary | ICD-10-CM

## 2020-02-05 ENCOUNTER — Other Ambulatory Visit: Payer: Self-pay

## 2020-02-06 ENCOUNTER — Ambulatory Visit (INDEPENDENT_AMBULATORY_CARE_PROVIDER_SITE_OTHER): Payer: Managed Care, Other (non HMO)

## 2020-02-06 ENCOUNTER — Other Ambulatory Visit: Payer: Self-pay | Admitting: Obstetrics & Gynecology

## 2020-02-06 DIAGNOSIS — M8589 Other specified disorders of bone density and structure, multiple sites: Secondary | ICD-10-CM

## 2020-02-06 DIAGNOSIS — Z780 Asymptomatic menopausal state: Secondary | ICD-10-CM | POA: Diagnosis not present

## 2020-03-06 ENCOUNTER — Other Ambulatory Visit: Payer: Self-pay | Admitting: Obstetrics & Gynecology

## 2020-03-11 ENCOUNTER — Other Ambulatory Visit: Payer: Self-pay | Admitting: Obstetrics & Gynecology

## 2020-04-08 ENCOUNTER — Other Ambulatory Visit: Payer: Self-pay | Admitting: Obstetrics & Gynecology

## 2020-04-16 DIAGNOSIS — U071 COVID-19: Secondary | ICD-10-CM

## 2020-04-16 HISTORY — DX: COVID-19: U07.1

## 2020-05-03 ENCOUNTER — Other Ambulatory Visit: Payer: Self-pay | Admitting: Obstetrics & Gynecology

## 2020-05-03 DIAGNOSIS — Z1231 Encounter for screening mammogram for malignant neoplasm of breast: Secondary | ICD-10-CM

## 2020-05-07 ENCOUNTER — Other Ambulatory Visit: Payer: Managed Care, Other (non HMO)

## 2020-05-07 ENCOUNTER — Other Ambulatory Visit: Payer: Self-pay

## 2020-05-07 DIAGNOSIS — R7309 Other abnormal glucose: Secondary | ICD-10-CM

## 2020-05-07 DIAGNOSIS — E7800 Pure hypercholesterolemia, unspecified: Secondary | ICD-10-CM

## 2020-05-07 DIAGNOSIS — E559 Vitamin D deficiency, unspecified: Secondary | ICD-10-CM

## 2020-05-08 LAB — LIPID PANEL
Cholesterol: 162 mg/dL (ref <200)
HDL: 61 mg/dL (ref >=50)
LDL Cholesterol (Calc): 81 mg/dL (calc)
Non-HDL Cholesterol (Calc): 101 mg/dL (calc) (ref <130)
Total CHOL/HDL Ratio: 2.7 (calc) (ref <5.0)
Triglycerides: 108 mg/dL (ref <150)

## 2020-05-08 LAB — HEMOGLOBIN A1C
Hgb A1c MFr Bld: 5.8 % of total Hgb — ABNORMAL HIGH (ref <5.7)
Mean Plasma Glucose: 120 (calc)
eAG (mmol/L): 6.6 (calc)

## 2020-05-08 LAB — VITAMIN D 25 HYDROXY (VIT D DEFICIENCY, FRACTURES): Vit D, 25-Hydroxy: 56 ng/mL (ref 30–100)

## 2020-06-10 ENCOUNTER — Other Ambulatory Visit: Payer: Self-pay | Admitting: Obstetrics & Gynecology

## 2020-06-11 ENCOUNTER — Ambulatory Visit: Payer: Managed Care, Other (non HMO)

## 2020-06-11 NOTE — Telephone Encounter (Signed)
Needs to establish with a Fam MD.  Can send #90 tab, no refill until finds a Fam MD.

## 2020-06-11 NOTE — Telephone Encounter (Signed)
Per DPR access note on file I left detailed message in patient's voice mailbox.

## 2020-06-18 ENCOUNTER — Ambulatory Visit
Admission: RE | Admit: 2020-06-18 | Discharge: 2020-06-18 | Disposition: A | Discharge status: Home / Self Care | Payer: Managed Care, Other (non HMO) | Source: Ambulatory Visit | Attending: Obstetrics & Gynecology | Admitting: Obstetrics & Gynecology

## 2020-06-18 ENCOUNTER — Other Ambulatory Visit: Payer: Self-pay

## 2020-06-18 DIAGNOSIS — Z1231 Encounter for screening mammogram for malignant neoplasm of breast: Secondary | ICD-10-CM

## 2020-07-01 ENCOUNTER — Telehealth: Payer: Self-pay | Admitting: *Deleted

## 2020-07-01 ENCOUNTER — Other Ambulatory Visit: Payer: Self-pay | Admitting: Obstetrics & Gynecology

## 2020-07-01 MED ORDER — ESTRADIOL 0.025 MG/24HR TD PTTW: 1.0000 | MEDICATED_PATCH | TRANSDERMAL | 4 refills | Status: DC

## 2020-07-01 NOTE — Telephone Encounter (Signed)
Patient called stating the pharmacy doesn't have the estradiol patch 0.025 mg patch Rx at pharmacy, asked if I could resend Rx. Rx sent.

## 2021-05-14 ENCOUNTER — Ambulatory Visit: Payer: Managed Care, Other (non HMO) | Admitting: Obstetrics & Gynecology

## 2021-05-20 ENCOUNTER — Other Ambulatory Visit: Payer: Self-pay | Admitting: Obstetrics & Gynecology

## 2021-05-20 DIAGNOSIS — Z1231 Encounter for screening mammogram for malignant neoplasm of breast: Secondary | ICD-10-CM

## 2021-06-03 ENCOUNTER — Ambulatory Visit: Payer: Managed Care, Other (non HMO) | Admitting: Obstetrics & Gynecology

## 2021-07-08 ENCOUNTER — Ambulatory Visit
Admission: RE | Admit: 2021-07-08 | Discharge: 2021-07-08 | Disposition: A | Discharge status: Home / Self Care | Payer: Managed Care, Other (non HMO) | Source: Ambulatory Visit | Attending: Obstetrics & Gynecology | Admitting: Obstetrics & Gynecology

## 2021-07-08 ENCOUNTER — Other Ambulatory Visit: Payer: Self-pay

## 2021-07-08 DIAGNOSIS — Z1231 Encounter for screening mammogram for malignant neoplasm of breast: Secondary | ICD-10-CM

## 2021-07-11 ENCOUNTER — Encounter: Payer: Managed Care, Other (non HMO) | Attending: Family Medicine | Admitting: Registered"

## 2021-07-11 DIAGNOSIS — R7303 Prediabetes: Secondary | ICD-10-CM | POA: Insufficient documentation

## 2021-07-17 ENCOUNTER — Encounter: Payer: Self-pay | Admitting: Registered"

## 2021-07-17 NOTE — Progress Notes (Signed)
On 07/11/21 patient completed Core Session 1 of Diabetes Prevention Program course virtually with [facility]. The following learning objectives were met by the patient during this class:   Virtual Visit via Video Note  I connected with Donna Baker by a video enabled application and verified that I am speaking with the correct person using two identifiers.  Location: Patient: Home.  Provider: Office.     Learning Objectives:  Be able to explain the purpose and benefits of the National Diabetes Prevention Program.  Be able to describe the events that will take place at every session.  Know the weight loss and physical activity goals established by the [facility] Diabetes Prevention Program.  Know their own individual weight loss and physical activity goals.  Be able to explain the important effect of self-monitoring on behavior change.   Goals:  Record food and beverage intake in "Food and Activity Tracker" over the next week.  E-mail completed "Food and Activity Tracker" to Lifestyle Coach next week before session 2. Circle the foods or beverages you think are highest in fat and calories in your food tracker. Read the labels on the food you buy, and consider using measuring cups and spoons to help you calculate the amount you eat. We will talk about measuring in more detail in the coming weeks.   Follow-Up Plan: Attend Core Session 2 next week.  E-mail completed "Food and Activity Tracker" to Lifestyle Coach next week before class.

## 2021-07-18 ENCOUNTER — Encounter: Payer: Managed Care, Other (non HMO) | Attending: Family Medicine | Admitting: Registered"

## 2021-07-18 DIAGNOSIS — R7303 Prediabetes: Secondary | ICD-10-CM | POA: Insufficient documentation

## 2021-07-22 ENCOUNTER — Encounter: Payer: Self-pay | Admitting: Registered"

## 2021-07-22 ENCOUNTER — Other Ambulatory Visit: Payer: Self-pay

## 2021-07-22 NOTE — Progress Notes (Signed)
On 07/18/21 patient completed Core Session 2 of Diabetes Prevention Program course virtually with [facility]. The following learning objectives were met by the patient during this class:   Virtual Visit via Video Note  I connected with Everlena Cooper by a video enabled application and verified that I am speaking with the correct person using two identifiers.  Location: Patient: Home.  Provider: Office.   Learning Objectives: Self-monitor their weight during the weeks following Session 2.  Describe the relationship between fat and calories.  Explain the reason for, and basic principles of, self-monitoring fat grams and calories.  Identify their personal fat gram goals.  Use the ?Fat and Calorie Counter to calculate the calories and fat grams of a given selection of foods.  Keep a running total of the fat grams they eat each day.  Calculate fat, calories, and serving sizes from nutrition labels.   Goals:  Weigh yourself at the same time each day, or every few days, and record your weight in your Food and Activity Tracker. Write down everything you eat and drink in your Food and Activity Tracker. Measure portions as much as you can, and start reading labels.  Use the ?Fat and Calorie Counter to figure out the amount of fat and calories in what you ate, and write the amount down in your Food and Activity Tracker. Keep a running fat gram total throughout the day. Come as close to your fat gram goal as you can.   Follow-Up Plan: Attend Core Session 3 next week.  Email completed  "Food and Activity Tracker" to Lifestyle Coach next week.

## 2021-07-25 ENCOUNTER — Encounter: Payer: Managed Care, Other (non HMO) | Admitting: Registered"

## 2021-08-26 ENCOUNTER — Ambulatory Visit: Payer: Managed Care, Other (non HMO) | Admitting: Obstetrics & Gynecology

## 2021-09-20 ENCOUNTER — Other Ambulatory Visit: Payer: Self-pay | Admitting: Obstetrics & Gynecology

## 2021-09-22 NOTE — Telephone Encounter (Signed)
Annual exam scheduled on 10/20/21

## 2021-09-30 ENCOUNTER — Emergency Department (HOSPITAL_BASED_OUTPATIENT_CLINIC_OR_DEPARTMENT_OTHER)
Admission: EM | Admit: 2021-09-30 | Discharge: 2021-09-30 | Disposition: A | Discharge status: Home / Self Care | Payer: Managed Care, Other (non HMO) | Attending: Emergency Medicine | Admitting: Emergency Medicine

## 2021-09-30 ENCOUNTER — Other Ambulatory Visit: Payer: Self-pay

## 2021-09-30 ENCOUNTER — Emergency Department (HOSPITAL_BASED_OUTPATIENT_CLINIC_OR_DEPARTMENT_OTHER): Payer: Managed Care, Other (non HMO)

## 2021-09-30 ENCOUNTER — Encounter (HOSPITAL_BASED_OUTPATIENT_CLINIC_OR_DEPARTMENT_OTHER): Payer: Self-pay

## 2021-09-30 DIAGNOSIS — R0602 Shortness of breath: Secondary | ICD-10-CM | POA: Insufficient documentation

## 2021-09-30 DIAGNOSIS — R5383 Other fatigue: Secondary | ICD-10-CM | POA: Insufficient documentation

## 2021-09-30 DIAGNOSIS — R7989 Other specified abnormal findings of blood chemistry: Secondary | ICD-10-CM | POA: Diagnosis not present

## 2021-09-30 DIAGNOSIS — R000 Tachycardia, unspecified: Secondary | ICD-10-CM | POA: Diagnosis not present

## 2021-09-30 DIAGNOSIS — Z87891 Personal history of nicotine dependence: Secondary | ICD-10-CM | POA: Diagnosis not present

## 2021-09-30 DIAGNOSIS — R6889 Other general symptoms and signs: Secondary | ICD-10-CM

## 2021-09-30 LAB — BASIC METABOLIC PANEL
Anion gap: 9 (ref 5–15)
BUN: 20 mg/dL (ref 8–23)
CO2: 26 mmol/L (ref 22–32)
Calcium: 9.3 mg/dL (ref 8.9–10.3)
Chloride: 106 mmol/L (ref 98–111)
Creatinine, Ser: 1.5 mg/dL — ABNORMAL HIGH (ref 0.44–1.00)
GFR, Estimated: 38 mL/min — ABNORMAL LOW (ref >60)
Glucose, Bld: 107 mg/dL — ABNORMAL HIGH (ref 70–99)
Potassium: 4.1 mmol/L (ref 3.5–5.1)
Sodium: 141 mmol/L (ref 135–145)

## 2021-09-30 MED ORDER — LACTATED RINGERS IV BOLUS
500.0000 mL | Freq: Once | INTRAVENOUS | Status: AC
  Administered 2021-09-30: 500 mL via INTRAVENOUS

## 2021-09-30 MED ORDER — IOHEXOL 350 MG/ML SOLN
100.0000 mL | Freq: Once | INTRAVENOUS | Status: AC | PRN
  Administered 2021-09-30: 60 mL via INTRAVENOUS

## 2021-09-30 NOTE — Discharge Instructions (Signed)
Talk to your primary care doctor about further testing, including cardiac function and valvular studies.  There is also a number below to call for the [facility] heart care group.  Call this number as needed for cardiology follow-up.  Continue to listen to your body and avoid activities that cause severe symptoms.  If you do develop severe symptoms, please return to the emergency department.

## 2021-09-30 NOTE — ED Triage Notes (Signed)
Sent here from [facility] due to elevated D-Dimer.

## 2021-09-30 NOTE — ED Notes (Signed)
Pt reports SOB that increases w/exertion x3-3.5 weeks. Increase travel over the last few weeks, a total of 10 flights in 3 weeks. Today during her workout she became dizzy and felt like she was going to pass out. Pt reports it was an easy workout

## 2021-09-30 NOTE — ED Provider Notes (Signed)
[facility] EMERGENCY DEPT Provider Note   CSN: [id number] Arrival date & time: 09/30/21  1944     History Chief Complaint  Patient presents with   abnormal labs    Donna Baker is a 65 y.o. female.  HPI Patient presents at the past of her family doctor due to elevated D-dimer.  She has had recent fatigue and exertional shortness of breath.  She has noticed the symptoms over the past 3 weeks.  She denies any orthopnea.  She does try to work out often.  During recent exercise session, she noticed that she was extremely short of breath with only limited exercise.  She has also noticed recently that her resting heart rate is in the 80s, significantly higher than it normally is.  These symptoms are what prompted her visit to her family doctor today.  Patient has never had any history of blood clots in the past.  She is not on any blood thinners.  She has had frequent recent travel.  She has not noticed any leg swelling.  She did notice a "charley horse" symptom in her right calf, during recent travel.  Labs, obtained today showed a D-dimer of 1.57.  8/26 creatinine was 0.70.  Currently, patient feels asymptomatic at rest.    Past Medical History:  Diagnosis Date   Edema extremities    flighs alot-legs swell-takes maxide as needed   Medical history non-contributory    Vitamin D deficiency     Patient Active Problem List   Diagnosis Date Noted   Hyperlipidemia 11/06/2014   Weight gain 08/09/2014   Fluid retention in legs 06/15/2013   Osteopenia 06/15/2013   Fibrocystic breast disease 08/16/2012    Past Surgical History:  Procedure Laterality Date   ABDOMINAL HYSTERECTOMY  2003   TAH-no ovaries   BREAST EXCISIONAL BIOPSY Right    BREAST SURGERY  1979   rt breast   CESAREAN SECTION     x2   COLONOSCOPY     KNEE SURGERY Right 05/26/2018   SHOULDER ARTHROSCOPY WITH ROTATOR CUFF REPAIR AND SUBACROMIAL DECOMPRESSION Right 01/19/2013   Procedure: RIGHT SHOULDER  ARTHROSCOPY DECOMPRESSION SUBACROMIAL PARTIAL ACROMIOPLASTY WITH CORACOACROMIAL RELEASE, ARTHROSCOPY SHOULDER DISTAL CLAVICULECTOMY, ARTHROSCOPY SHOULDER WITH ROTATOR CUFF REPAIR ;  Surgeon: Loreta Ave, MD;  Location: [facility];  Service: Orthopedics;  Laterality: Right;  RIGHT SHOULDER SCOPE, DISTAL CLAVICAL EXCISION, ACROMIOPLASTY, ROTATOR CUFF REPAI   TONSILLECTOMY       OB History     Gravida  3   Para  2   Term      Preterm      AB  1   Living  2      SAB  1   IAB      Ectopic      Multiple      Live Births              Family History  Problem Relation Age of Onset   Cancer Father        died 65 liver CA    Social History   Tobacco Use   Smoking status: Former    Types: Cigarettes    Quit date: 11/16/1978    Years since quitting: 42.9   Smokeless tobacco: Never  Vaping Use   Vaping Use: Never used  Substance Use Topics   Alcohol use: Yes    Alcohol/week: 3.0 standard drinks    Types: 3 Glasses of wine per week    Comment:  occ   Drug use: No    Home Medications Prior to Admission medications   Medication Sig Start Date End Date Taking? Authorizing Provider  atorvastatin (LIPITOR) 10 MG tablet TAKE 1 TABLET BY MOUTH EVERY DAY 06/11/20   Donna Bruins, MD  estradiol (VIVELLE-DOT) 0.025 MG/24HR PLACE 1 PATCH ONTO THE SKIN 2 (TWO) TIMES A WEEK. 09/22/21   Donna Bruins, MD  fish oil-omega-3 fatty acids 1000 MG capsule Take 2 g by mouth daily.    [provider]  triamterene-hydrochlorothiazide (MAXZIDE) 75-50 MG tablet TAKE 1 TABLET BY MOUTH EVERY DAY AS NEEDED Patient not taking: Reported on 01/15/2020 07/05/19   Huel Cote, NP  Vitamin D, Ergocalciferol, (DRISDOL) 1.25 MG (50000 UNIT) CAPS capsule Take 1 capsule (50,000 Units total) by mouth every 7 (seven) days. 01/22/20   Donna Bruins, MD    Allergies    Sulfa antibiotics  Review of Systems   Review of Systems  Constitutional:  Positive for  fatigue. Negative for activity change, appetite change, chills, diaphoresis and fever.  HENT:  Negative for ear pain and sore throat.   Eyes:  Negative for pain and visual disturbance.  Respiratory:  Positive for shortness of breath (Exertional). Negative for cough, chest tightness and wheezing.   Cardiovascular:  Negative for chest pain, palpitations and leg swelling.  Gastrointestinal:  Negative for abdominal pain, nausea and vomiting.  Genitourinary:  Negative for dysuria, flank pain and hematuria.  Musculoskeletal:  Negative for arthralgias, back pain, myalgias and neck pain.  Skin:  Negative for color change and rash.  Neurological:  Negative for dizziness, seizures, syncope, weakness, light-headedness, numbness and headaches.  Hematological:  Does not bruise/bleed easily.  All other systems reviewed and are negative.  Physical Exam Updated Vital Signs BP 126/67 (BP Location: Right Arm)   Pulse 97   Temp 98.4 F (36.9 C) (Oral)   Resp 18   Ht 5' (1.524 m)   Wt 59.9 kg   SpO2 100%   BMI 25.78 kg/m   Physical Exam Vitals and nursing note reviewed.  Constitutional:      General: She is not in acute distress.    Appearance: Normal appearance. She is well-developed and normal weight. She is not ill-appearing, toxic-appearing or diaphoretic.  HENT:     Head: Normocephalic and atraumatic.     Right Ear: External ear normal.     Left Ear: External ear normal.     Nose: No congestion or rhinorrhea.     Mouth/Throat:     Mouth: Mucous membranes are moist.     Pharynx: Oropharynx is clear.  Eyes:     General: No scleral icterus.    Extraocular Movements: Extraocular movements intact.     Conjunctiva/sclera: Conjunctivae normal.  Cardiovascular:     Rate and Rhythm: Normal rate and regular rhythm.     Heart sounds: No murmur heard. Pulmonary:     Effort: Pulmonary effort is normal. No respiratory distress.     Breath sounds: Normal breath sounds. No wheezing or rales.   Chest:     Chest wall: No tenderness.  Abdominal:     Palpations: Abdomen is soft.     Tenderness: There is no abdominal tenderness.  Musculoskeletal:        General: No swelling or tenderness. Normal range of motion.     Cervical back: Normal range of motion and neck supple. No rigidity.     Right lower leg: No edema.     Left lower leg: No  edema.  Skin:    General: Skin is warm and dry.     Coloration: Skin is not jaundiced or pale.  Neurological:     General: No focal deficit present.     Mental Status: She is alert and oriented to person, place, and time.     Cranial Nerves: No cranial nerve deficit.     Sensory: No sensory deficit.     Motor: No weakness.  Psychiatric:        Mood and Affect: Mood normal.        Behavior: Behavior normal.        Thought Content: Thought content normal.        Judgment: Judgment normal.    ED Results / Procedures / Treatments   Labs (all labs ordered are listed, but only abnormal results are displayed) Labs Reviewed  BASIC METABOLIC PANEL - Abnormal; Notable for the following components:      Result Value   Glucose, Bld 107 (*)    Creatinine, Ser 1.50 (*)    GFR, Estimated 38 (*)    All other components within normal limits    EKG EKG Interpretation  Date/Time:  Tuesday September 30 2021 20:22:06 EST Ventricular Rate:  95 PR Interval:  152 QRS Duration: 75 QT Interval:  350 QTC Calculation: 440 R Axis:   33 Text Interpretation: Sinus rhythm Confirmed by Godfrey Pick [phone]) on 09/30/2021 8:28:49 PM  Radiology CT Angio Chest PE W and/or Wo Contrast  Result Date: 09/30/2021 CLINICAL DATA:  Pulmonary embolism, elevated D-dimer EXAM: CT ANGIOGRAPHY CHEST WITH CONTRAST TECHNIQUE: Multidetector CT imaging of the chest was performed using the standard protocol during bolus administration of intravenous contrast. Multiplanar CT image reconstructions and MIPs were obtained to evaluate the vascular anatomy. CONTRAST:  51mL OMNIPAQUE  IOHEXOL 350 MG/ML SOLN COMPARISON:  None. FINDINGS: Cardiovascular: Adequate opacification of the pulmonary arterial tree. No intraluminal filling defect identified to suggest acute pulmonary embolism. Central pulmonary arteries are of normal caliber. No significant coronary artery calcification. Cardiac size within normal limits. No pericardial effusion. No significant atherosclerotic calcification within the thoracic aorta. No aortic aneurysm. Mediastinum/Nodes: No enlarged mediastinal, hilar, or axillary lymph nodes. Thyroid gland, trachea, and esophagus demonstrate no significant findings. Lungs/Pleura: Lungs are clear. No pleural effusion or pneumothorax. Upper Abdomen: Multiple probable cysts are partially visualized within the liver. No acute abnormality. Musculoskeletal: No acute bone abnormality. Osseous structures are age-appropriate. Review of the MIP images confirms the above findings. IMPRESSION: No pulmonary embolism. No acute intrathoracic pathology identified. No definite radiographic explanation for the patient's reported symptoms. Electronically Signed   By: Fidela Salisbury M.D.   On: 09/30/2021 22:19    Procedures Procedures   Medications Ordered in ED Medications  lactated ringers bolus 500 mL (0 mLs Intravenous Stopped 09/30/21 2255)  iohexol (OMNIPAQUE) 350 MG/ML injection 100 mL (60 mLs Intravenous Contrast Given 09/30/21 2159)    ED Course  I have reviewed the triage vital signs and the nursing notes.  Pertinent labs & imaging results that were available during my care of the patient were reviewed by me and considered in my medical decision making (see chart for details).    MDM Rules/Calculators/A&P                          Patient is a healthy 65 year old female who has had recent travel and has recently noticed symptoms of fatigue, exertional shortness of breath, and heart rate elevated above what  it normally is.  This prompted her to go see her primary care doctor who  obtained lab work today.  Lab work was notable for a D-dimer of 1.57.  She was instructed to come to the ED for evaluation of possible pulmonary embolism.  She is well-appearing on arrival.  Vital signs are normal at rest.  EKG shows no evidence of heart strain.  CTA of chest was ordered.  Results showed no evidence of PE.  Interestingly, patient's creatinine was elevated to 1.5 today.  Bolus of IV fluids was given.  Following CTA of chest, patient remained asymptomatic at rest.  Bedside echocardiogram showed grossly normal cardiac function but did show bibasilar B-lines.  Patient would benefit from further cardiac testing.  She was advised to follow-up with her primary care doctor to schedule this.  Additionally, contact information for [facility] was provided.  Patient was also encouraged to return to the ED for any worsening severity of symptoms.  She was discharged in good condition.  Final Clinical Impression(s) / ED Diagnoses Final diagnoses:  Exercise intolerance    Rx / DC Orders ED Discharge Orders     None        Godfrey Pick, MD 09/30/21 2258

## 2021-10-03 ENCOUNTER — Encounter (HOSPITAL_COMMUNITY): Payer: Self-pay | Admitting: Family Medicine

## 2021-10-03 ENCOUNTER — Observation Stay (HOSPITAL_COMMUNITY)
Admission: RE | Admit: 2021-10-03 | Discharge: 2021-10-04 | Disposition: A | Discharge status: Home / Self Care | Payer: Managed Care, Other (non HMO) | Source: Ambulatory Visit | Attending: Internal Medicine | Admitting: Internal Medicine

## 2021-10-03 ENCOUNTER — Other Ambulatory Visit: Payer: Self-pay

## 2021-10-03 DIAGNOSIS — N289 Disorder of kidney and ureter, unspecified: Secondary | ICD-10-CM | POA: Diagnosis not present

## 2021-10-03 DIAGNOSIS — Z20822 Contact with and (suspected) exposure to covid-19: Secondary | ICD-10-CM | POA: Diagnosis not present

## 2021-10-03 DIAGNOSIS — K625 Hemorrhage of anus and rectum: Secondary | ICD-10-CM | POA: Diagnosis present

## 2021-10-03 DIAGNOSIS — R7303 Prediabetes: Secondary | ICD-10-CM | POA: Insufficient documentation

## 2021-10-03 DIAGNOSIS — K648 Other hemorrhoids: Secondary | ICD-10-CM | POA: Insufficient documentation

## 2021-10-03 DIAGNOSIS — Z87891 Personal history of nicotine dependence: Secondary | ICD-10-CM | POA: Diagnosis not present

## 2021-10-03 DIAGNOSIS — D649 Anemia, unspecified: Principal | ICD-10-CM | POA: Insufficient documentation

## 2021-10-03 DIAGNOSIS — N179 Acute kidney failure, unspecified: Secondary | ICD-10-CM | POA: Diagnosis not present

## 2021-10-03 LAB — RESP PANEL BY RT-PCR (FLU A&B, COVID) ARPGX2
Influenza A by PCR: NEGATIVE
Influenza B by PCR: NEGATIVE
SARS Coronavirus 2 by RT PCR: NEGATIVE

## 2021-10-03 MED ORDER — OXYCODONE HCL 5 MG PO TABS: 5.0000 mg | ORAL_TABLET | ORAL | Status: DC | PRN

## 2021-10-03 MED ORDER — SODIUM CHLORIDE 0.9% IV SOLUTION: Freq: Once | INTRAVENOUS | Status: AC

## 2021-10-03 MED ORDER — ACETAMINOPHEN 325 MG PO TABS
650.0000 mg | ORAL_TABLET | Freq: Four times a day (QID) | ORAL | Status: DC | PRN
  Administered 2021-10-04: 650 mg via ORAL
  Filled 2021-10-03: qty 2

## 2021-10-03 MED ORDER — ACETAMINOPHEN 650 MG RE SUPP: 650.0000 mg | Freq: Four times a day (QID) | RECTAL | Status: DC | PRN

## 2021-10-03 NOTE — Progress Notes (Addendum)
Patient arrived to the unit assist to room 6N19. Assessment completed and IV inserted. Denies SOB or pain at this time. Oriented to room and use of call bell. Placed call to patient placement awaiting for MD orders.

## 2021-10-03 NOTE — H&P (Signed)
History and Physical    Donna Baker:[id number] DOB: 12/06/1955 DOA: 10/03/2021  PCP: Gweneth Dimitri, MD   Patient coming from: Home   Chief Complaint: DOE, fatigue, low Hgb   HPI: Donna Baker is a pleasant 65 y.o. female with medical history significant for bleeding internal hemorrhoids, now presenting as direct admission for symptomatic anemia.  Patient reports a long history of bleeding hemorrhoids, had much more bleeding than usual beginning 3 or 4 weeks ago, and then developed progressive fatigue and exertional dyspnea over the past 2 to 3 weeks.  She had been traveling a lot for work and initially attributed her fatigue and dyspnea to that, but her activity was severely limited and so she saw her PCP on 09/30/2021.  She was found to have hemoglobin 5.0, D-dimer 1.57, and creatinine 1.50 with her PCP and was directed to the ED.  She was seen at [facility] that same day and had negative CTA chest.  She had repeat CBC yesterday with hemoglobin 5.2.  She was seen in [facility] today, reports having a negative FOBT, and was scheduled for outpatient endoscopy.  PCP arranged direct admission for expedited blood transfusion.  Patient reports no bleeding in the past week.  She has not had any abdominal pain, nausea, or vomiting throughout this course.   Review of Systems:  All other systems reviewed and apart from HPI, are negative.  Past Medical History:  Diagnosis Date   Edema extremities    flighs alot-legs swell-takes maxide as needed   Medical history non-contributory    Vitamin D deficiency     Past Surgical History:  Procedure Laterality Date   ABDOMINAL HYSTERECTOMY  2003   TAH-no ovaries   BREAST EXCISIONAL BIOPSY Right    BREAST SURGERY  1979   rt breast   CESAREAN SECTION     x2   COLONOSCOPY     KNEE SURGERY Right 05/26/2018   SHOULDER ARTHROSCOPY WITH ROTATOR CUFF REPAIR AND SUBACROMIAL DECOMPRESSION Right 01/19/2013   Procedure: RIGHT SHOULDER ARTHROSCOPY  DECOMPRESSION SUBACROMIAL PARTIAL ACROMIOPLASTY WITH CORACOACROMIAL RELEASE, ARTHROSCOPY SHOULDER DISTAL CLAVICULECTOMY, ARTHROSCOPY SHOULDER WITH ROTATOR CUFF REPAIR ;  Surgeon: Loreta Ave, MD;  Location: [facility];  Service: Orthopedics;  Laterality: Right;  RIGHT SHOULDER SCOPE, DISTAL CLAVICAL EXCISION, ACROMIOPLASTY, ROTATOR CUFF REPAI   TONSILLECTOMY      Social History:   reports that she quit smoking about 42 years ago. Her smoking use included cigarettes. She has never used smokeless tobacco. She reports current alcohol use of about 3.0 standard drinks per week. She reports that she does not use drugs.  Allergies  Allergen Reactions   Sulfa Antibiotics     unknown    Family History  Problem Relation Age of Onset   Cancer Father        died 78 liver CA     Prior to Admission medications   Medication Sig Start Date End Date Taking? Authorizing Provider  atorvastatin (LIPITOR) 10 MG tablet TAKE 1 TABLET BY MOUTH EVERY DAY 06/11/20   Genia Del, MD  estradiol (VIVELLE-DOT) 0.025 MG/24HR PLACE 1 PATCH ONTO THE SKIN 2 (TWO) TIMES A WEEK. 09/22/21   Genia Del, MD  fish oil-omega-3 fatty acids 1000 MG capsule Take 2 g by mouth daily.    [provider]  triamterene-hydrochlorothiazide (MAXZIDE) 75-50 MG tablet TAKE 1 TABLET BY MOUTH EVERY DAY AS NEEDED Patient not taking: Reported on 01/15/2020 07/05/19   Harrington Challenger, NP  Vitamin  D, Ergocalciferol, (DRISDOL) 1.25 MG (50000 UNIT) CAPS capsule Take 1 capsule (50,000 Units total) by mouth every 7 (seven) days. 01/22/20   Genia Del, MD    Physical Exam: Vitals:   10/03/21 1951 10/03/21 1955 10/04/21 0005  BP:  (!) 103/59 102/60  Pulse:  87 88  Resp:  17 18  Temp: 98.4 F (36.9 C) 98.4 F (36.9 C) 98.2 F (36.8 C)  TempSrc:   Oral  SpO2:  99% 99%  Weight:  59 kg   Height:  5' (1.524 m)     Constitutional: NAD, calm  Eyes: PERTLA, lids and conjunctivae normal ENMT:  Mucous membranes are moist. Posterior pharynx clear of any exudate or lesions.   Neck: supple, no masses  Respiratory:  no wheezing, no crackles. No accessory muscle use.  Cardiovascular: S1 & S2 heard, regular rate and rhythm. No extremity edema.  Abdomen: No distension, no tenderness, soft. Bowel sounds active.  Musculoskeletal: no clubbing / cyanosis. No joint deformity upper and lower extremities.   Skin: no significant rashes, lesions, ulcers. Warm, dry, well-perfused. Neurologic: CN 2-12 grossly intact. Moving all extremities. Alert and oriented.  Psychiatric: Very pleasant. Cooperative.    Labs and Imaging on Admission: I have personally reviewed following labs and imaging studies  CBC: No results for input(s): WBC, NEUTROABS, HGB, HCT, MCV, PLT in the last 168 hours. Basic Metabolic Panel: Recent Labs  Lab 09/30/21 2119  NA 141  K 4.1  CL 106  CO2 26  GLUCOSE 107*  BUN 20  CREATININE 1.50*  CALCIUM 9.3   GFR: Estimated Creatinine Clearance: 29.6 mL/min (A) (by C-G formula based on SCr of 1.5 mg/dL (H)). Liver Function Tests: No results for input(s): AST, ALT, ALKPHOS, BILITOT, PROT, ALBUMIN in the last 168 hours. No results for input(s): LIPASE, AMYLASE in the last 168 hours. No results for input(s): AMMONIA in the last 168 hours. Coagulation Profile: No results for input(s): INR, PROTIME in the last 168 hours. Cardiac Enzymes: No results for input(s): CKTOTAL, CKMB, CKMBINDEX, TROPONINI in the last 168 hours. BNP (last 3 results) No results for input(s): PROBNP in the last 8760 hours. HbA1C: No results for input(s): HGBA1C in the last 72 hours. CBG: No results for input(s): GLUCAP in the last 168 hours. Lipid Profile: No results for input(s): CHOL, HDL, LDLCALC, TRIG, CHOLHDL, LDLDIRECT in the last 72 hours. Thyroid Function Tests: No results for input(s): TSH, T4TOTAL, FREET4, T3FREE, THYROIDAB in the last 72 hours. Anemia Panel: No results for input(s):  VITAMINB12, FOLATE, FERRITIN, TIBC, IRON, RETICCTPCT in the last 72 hours. Urine analysis:    Component Value Date/Time   COLORURINE YELLOW 01/10/2018 1513   APPEARANCEUR CLEAR 01/10/2018 1513   LABSPEC 1.020 01/10/2018 1513   PHURINE 7.0 01/10/2018 1513   GLUCOSEU NEGATIVE 01/10/2018 1513   HGBUR NEGATIVE 01/10/2018 1513   BILIRUBINUR NEGATIVE 06/17/2017 1543   KETONESUR NEGATIVE 01/10/2018 1513   PROTEINUR NEGATIVE 01/10/2018 1513   UROBILINOGEN 0.2 08/09/2014 1248   NITRITE NEGATIVE 06/17/2017 1543   LEUKOCYTESUR NEGATIVE 06/17/2017 1543   Sepsis Labs: @LABRCNTIP (procalcitonin:4,lacticidven:4) ) Recent Results (from the past 240 hour(s))  Resp Panel by RT-PCR (Flu A&B, Covid) Nasopharyngeal Swab     Status: None   Collection Time: 10/03/21  8:31 PM   Specimen: Nasopharyngeal Swab; Nasopharyngeal(NP) swabs in vial transport medium  Result Value Ref Range Status   SARS Coronavirus 2 by RT PCR NEGATIVE NEGATIVE Final    Comment: (NOTE) SARS-CoV-2 target nucleic acids are NOT DETECTED.  The SARS-CoV-2 RNA is generally detectable in upper respiratory specimens during the acute phase of infection. The lowest concentration of SARS-CoV-2 viral copies this assay can detect is 138 copies/mL. A negative result does not preclude SARS-Cov-2 infection and should not be used as the sole basis for treatment or other patient management decisions. A negative result may occur with  improper specimen collection/handling, submission of specimen other than nasopharyngeal swab, presence of viral mutation(s) within the areas targeted by this assay, and inadequate number of viral copies(<138 copies/mL). A negative result must be combined with clinical observations, patient history, and epidemiological information. The expected result is Negative.  Fact Sheet for Patients:  BloggerCourse.com  Fact Sheet for Healthcare Providers:   SeriousBroker.it  This test is no t yet approved or cleared by the [place] FDA and  has been authorized for detection and/or diagnosis of SARS-CoV-2 by FDA under an Emergency Use Authorization (EUA). This EUA will remain  in effect (meaning this test can be used) for the duration of the COVID-19 declaration under Section 564(b)(1) of the Act, 21 U.S.C.section 360bbb-3(b)(1), unless the authorization is terminated  or revoked sooner.       Influenza A by PCR NEGATIVE NEGATIVE Final   Influenza B by PCR NEGATIVE NEGATIVE Final    Comment: (NOTE) The Xpert Xpress SARS-CoV-2/FLU/RSV plus assay is intended as an aid in the diagnosis of influenza from Nasopharyngeal swab specimens and should not be used as a sole basis for treatment. Nasal washings and aspirates are unacceptable for Xpert Xpress SARS-CoV-2/FLU/RSV testing.  Fact Sheet for Patients: BloggerCourse.com  Fact Sheet for Healthcare Providers: SeriousBroker.it  This test is not yet approved or cleared by the [place] FDA and has been authorized for detection and/or diagnosis of SARS-CoV-2 by FDA under an Emergency Use Authorization (EUA). This EUA will remain in effect (meaning this test can be used) for the duration of the COVID-19 declaration under Section 564(b)(1) of the Act, 21 U.S.C. section 360bbb-3(b)(1), unless the authorization is terminated or revoked.  Performed at [facility], [address] [place], [place] [address]      Radiological Exams on Admission: No results found.   Assessment/Plan  1. Symptomatic anemia  - Presents w/ 3 wks of DOE and fatigue in setting of increased hemorrhoidal bleeding and was found to have Hgb 5.0 with PCP  - Pt reports no bleeding in a wk, had neg FOBT in GI clinic today, and was scheduled for outpatient endoscopy  - Type and screen, transfuse 2 units, check post-transfusion  CBC, proceed with outpatient endoscopy as planned if no further bleeding or complication   2. Renal insufficiency  - SCr 1.50 on 09/30/21, up from 0.70 in August 2022  - Presumably acute prerenal injury from blood-loss  - Check UA and FENa, renally-dose medications, monitor     DVT prophylaxis: SCDs  Code Status: Full  Level of Care: Level of care: Med-Surg Family Communication: none present  Disposition Plan:  Patient is from: home  Anticipated d/c is to: Home  Anticipated d/c date is: 11/19 or 10/05/21 Patient currently: Pending blood transfusion and post-transfusion CBC  Consults called: none  Admission status: Observation     Briscoe Deutscher, MD [facility]  10/04/2021, 12:21 AM

## 2021-10-04 DIAGNOSIS — D649 Anemia, unspecified: Secondary | ICD-10-CM | POA: Diagnosis not present

## 2021-10-04 LAB — CBC WITH DIFFERENTIAL/PLATELET
Abs Immature Granulocytes: 0.03 10*3/uL (ref 0.00–0.07)
Basophils Absolute: 0.1 10*3/uL (ref 0.0–0.1)
Basophils Relative: 1 %
Eosinophils Absolute: 0.1 10*3/uL (ref 0.0–0.5)
Eosinophils Relative: 3 %
HCT: 24.1 % — ABNORMAL LOW (ref 36.0–46.0)
Hemoglobin: 7.5 g/dL — ABNORMAL LOW (ref 12.0–15.0)
Immature Granulocytes: 1 %
Lymphocytes Relative: 34 %
Lymphs Abs: 1.9 10*3/uL (ref 0.7–4.0)
MCH: 24.6 pg — ABNORMAL LOW (ref 26.0–34.0)
MCHC: 31.1 g/dL (ref 30.0–36.0)
MCV: 79 fL — ABNORMAL LOW (ref 80.0–100.0)
Monocytes Absolute: 0.3 10*3/uL (ref 0.1–1.0)
Monocytes Relative: 5 %
Neutro Abs: 3.2 10*3/uL (ref 1.7–7.7)
Neutrophils Relative %: 56 %
Platelets: 392 10*3/uL (ref 150–400)
RBC: 3.05 MIL/uL — ABNORMAL LOW (ref 3.87–5.11)
RDW: 18.8 % — ABNORMAL HIGH (ref 11.5–15.5)
WBC: 5.6 10*3/uL (ref 4.0–10.5)
nRBC: 1.1 % — ABNORMAL HIGH (ref 0.0–0.2)

## 2021-10-04 LAB — RETICULOCYTES
Immature Retic Fract: 43.1 % — ABNORMAL HIGH (ref 2.3–15.9)
RBC.: 3.04 MIL/uL — ABNORMAL LOW (ref 3.87–5.11)
Retic Count, Absolute: 96.5 10*3/uL (ref 19.0–186.0)
Retic Ct Pct: 3.3 % — ABNORMAL HIGH (ref 0.4–3.1)

## 2021-10-04 LAB — BASIC METABOLIC PANEL
Anion gap: 8 (ref 5–15)
BUN: 16 mg/dL (ref 8–23)
CO2: 21 mmol/L — ABNORMAL LOW (ref 22–32)
Calcium: 8.9 mg/dL (ref 8.9–10.3)
Chloride: 107 mmol/L (ref 98–111)
Creatinine, Ser: 0.85 mg/dL (ref 0.44–1.00)
GFR, Estimated: >60 mL/min (ref >60)
Glucose, Bld: 108 mg/dL — ABNORMAL HIGH (ref 70–99)
Potassium: 3.7 mmol/L (ref 3.5–5.1)
Sodium: 136 mmol/L (ref 135–145)

## 2021-10-04 LAB — CREATININE, URINE, RANDOM: Creatinine, Urine: 96.92 mg/dL

## 2021-10-04 LAB — URINALYSIS, COMPLETE (UACMP) WITH MICROSCOPIC
Bilirubin Urine: NEGATIVE
Glucose, UA: NEGATIVE mg/dL
Hgb urine dipstick: NEGATIVE
Ketones, ur: NEGATIVE mg/dL
Leukocytes,Ua: NEGATIVE
Nitrite: NEGATIVE
Protein, ur: NEGATIVE mg/dL
Specific Gravity, Urine: 1.012 (ref 1.005–1.030)
pH: 5 (ref 5.0–8.0)

## 2021-10-04 LAB — IRON AND TIBC
Iron: 274 ug/dL — ABNORMAL HIGH (ref 28–170)
Saturation Ratios: 49 % — ABNORMAL HIGH (ref 10.4–31.8)
TIBC: 563 ug/dL — ABNORMAL HIGH (ref 250–450)
UIBC: 289 ug/dL

## 2021-10-04 LAB — HIV ANTIBODY (ROUTINE TESTING W REFLEX): HIV Screen 4th Generation wRfx: NONREACTIVE

## 2021-10-04 LAB — FERRITIN: Ferritin: 8 ng/mL — ABNORMAL LOW (ref 11–307)

## 2021-10-04 LAB — VITAMIN B12: Vitamin B-12: 890 pg/mL (ref 180–914)

## 2021-10-04 LAB — ABO/RH: ABO/RH(D): A NEG

## 2021-10-04 LAB — PREPARE RBC (CROSSMATCH)

## 2021-10-04 LAB — FOLATE: Folate: 39 ng/mL (ref >5.9)

## 2021-10-04 LAB — SODIUM, URINE, RANDOM: Sodium, Ur: 58 mmol/L

## 2021-10-04 MED ORDER — FERROUS SULFATE 325 (65 FE) MG PO TABS: 325.0000 mg | ORAL_TABLET | Freq: Every day | ORAL | Status: DC

## 2021-10-04 NOTE — Progress Notes (Signed)
Mobility Specialist Progress Note:   10/04/21 1020  Mobility  Activity Ambulated in hall  Level of Assistance Independent  Assistive Device None  Distance Ambulated (ft) 560 ft  Mobility Ambulated independently in hallway  Mobility Response Tolerated well  Mobility performed by Mobility specialist  $Mobility charge 1 Mobility   Pt c/o minor SOB during ambulation, however states it has improved significantly. SpO2 99% during ambulation. No dizziness, or weakness. Encouraged amb in hall as long as she's asx.    Addison Lank Mobility Specialist  Phone [phone]

## 2021-10-04 NOTE — Discharge Summary (Signed)
Physician Discharge Summary  Donna Baker WUJ:[id number] DOB: 29-Aug-1956 DOA: 10/03/2021  PCP: Gweneth Dimitri, MD  Admit date: 10/03/2021 Discharge date: 10/04/2021  Admitted From: Home Discharge disposition: Home   Code Status: Full Code   Discharge Diagnosis:   Principal Problem:   Symptomatic anemia Active Problems:   Renal insufficiency   Rectal bleeding    Chief complaint: Symptomatic anemia  Brief narrative: Donna Baker is a 65 y.o. female with PMH significant for bleeding internal hemorrhoids was sent for direct admission on 11/18 by [facility] for symptomatic anemia.   Patient has a long history of bleeding hemorrhoids with excessive bleeding for last 3 to 4 weeks associated with progressive fatigue, exertional dyspnea. On 11/15, patient was seen by her PCP for worsening fatigue.  Since patient travels a lot for work, pulmonary embolism was also in the differentials.  D-dimer was found to be elevated to 1.57 and patient was sent to the ED.  She had a negative CTA chest and hence discharged to home.  That day, she also was noted to have a low hemoglobin of 5 and is referred to [facility].  On 11/18, she was seen at [facility], repeat hemoglobin was still low at 5.2, patient also reported FOBT and as directed to ED for blood transfusion Admitted to hospital service.  Subjective: Patient was seen and examined this morning.  Pleasant.  Lying down in bed.  Not in distress.  Feels better after blood transfusion. Chart reviewed.  Hemodynamically stable Repeat hemoglobin this morning 7.5 after blood transfusion.  Hospital course: Symptomatic anemia  Progressive hemorrhoidal bleeding -Presented with progressively worsening hemorrhoidal bleeding, symptomatic anemia.   -Sent from GI clinic for blood transfusion.  2 units of PRBC transfused.  Hemoglobin 7.5 after transfusion.  Will discharge home today.  Per H&P, patient has been planned for outpatient endoscopy. -Ferritin  level low at 8 due to chronic GI bleeding.  She states she has been recently started on iron supplement.  Continue the same. Recent Labs    10/04/21 1122  HGB 7.5*  MCV 79.0*  VITAMINB12 890  FERRITIN 8*  TIBC 563*  IRON 274*  RETICCTPCT 3.3*   AKI -Baseline creatinine 0.7 from August 2022.  Presented with a creatinine elevated to 1.5 presumably because of blood loss. -Improved this morning to 0.8. Recent Labs    09/30/21 2119 10/04/21 0123  BUN 20 16  CREATININE 1.50* 0.85   Prediabetes -A1c 5.8 from 2021. -Home meds include metformin 5 mg daily -Continue the same.  Repeat A1c with PCP.  Hyperlipidemia -Continue statin   Allergies as of 10/04/2021       Reactions   Sulfa Antibiotics    unknown        Medication List     STOP taking these medications    ibuprofen 200 MG tablet Commonly known as: ADVIL       TAKE these medications    atorvastatin 10 MG tablet Commonly known as: LIPITOR TAKE 1 TABLET BY MOUTH EVERY DAY   estradiol 0.025 MG/24HR Commonly known as: VIVELLE-DOT PLACE 1 PATCH ONTO THE SKIN 2 (TWO) TIMES A WEEK.   ferrous sulfate 325 (65 FE) MG tablet Take 1 tablet (325 mg total) by mouth daily.   metFORMIN 500 MG 24 hr tablet Commonly known as: GLUCOPHAGE-XR Take 500 mg by mouth every evening.   multivitamin tablet Take 1 tablet by mouth daily.        Discharge Instructions:  Diet Recommendation:  Discharge Diet Orders (From admission, onward)     Start     Ordered   10/04/21 0000  Diet general       Comments: Diet Orders (From admission, onward)    Start     Ordered   10/04/21 0016  Diet regular Room service appropriate? Yes; Fluid  consistency: Thin  Diet effective now       Question Answer Comment  Room service appropriate? Yes   Fluid consistency: Thin     10/04/21 0015       10/04/21 1327              @BRDDSCINSTRUCTIONS @  Follow ups:    Follow-up Information     , MD Follow up.    Specialty: Family Medicine Contact information: [address] [place] [phone]                 Wound care:   Incision 01/19/13 Shoulder Right (Active)  Date First Assessed/Time First Assessed: 01/19/13 1309   Location: Shoulder  Location Orientation: Right    Assessments 01/19/2013  1:14 PM 01/19/2013  2:56 PM  Dressing Type Abdominal pads --  Dressing Clean;Dry;Intact Clean;Dry  Drainage Amount None None     No Linked orders to display    Discharge Exam:   Vitals:   10/04/21 0638 10/04/21 0858 10/04/21 0908 10/04/21 1125  BP: 100/63 99/61  (!) 108/59  Pulse: 70 72 71 73  Resp: 18 16 18 18   Temp: 98.4 F (36.9 C) 98 F (36.7 C) 98 F (36.7 C) 98.2 F (36.8 C)  TempSrc: Oral Oral Oral Oral  SpO2: 100% 99% 99% 100%  Weight:      Height:        Body mass index is 26.65 kg/m.  General exam: Pleasant, elderly Caucasian female.  Not in distress Skin: No rashes, lesions or ulcers. HEENT: Atraumatic, normocephalic, no obvious bleeding Lungs: Clear to auscultation bilaterally CVS: Regular rate and rhythm, no murmur GI/Abd soft, nontender, nondistended, bowel sound present CNS: Alert, awake, oriented x3 Psychiatry: Mood appropriate Extremities: No pedal edema, no calf tenderness  Time coordinating discharge: 35 minutes   The results of significant diagnostics from this hospitalization (including imaging, microbiology, ancillary and laboratory) are listed below for reference.    Procedures and Diagnostic Studies:   No results found.   Labs:   Basic Metabolic Panel: Recent Labs  Lab 09/30/21 2119 10/04/21 0123  NA 141 136  K 4.1 3.7  CL 106 107  CO2 26 21*  GLUCOSE 107* 108*  BUN 20 16  CREATININE 1.50* 0.85  CALCIUM 9.3 8.9   GFR Estimated Creatinine Clearance: 53.5 mL/min (by C-G formula based on SCr of 0.85 mg/dL). Liver Function Tests: No results for input(s): AST, ALT, ALKPHOS, BILITOT, PROT, ALBUMIN in the last 168  hours. No results for input(s): LIPASE, AMYLASE in the last 168 hours. No results for input(s): AMMONIA in the last 168 hours. Coagulation profile No results for input(s): INR, PROTIME in the last 168 hours.  CBC: Recent Labs  Lab 10/04/21 1122  WBC 5.6  NEUTROABS 3.2  HGB 7.5*  HCT 24.1*  MCV 79.0*  PLT 392   Cardiac Enzymes: No results for input(s): CKTOTAL, CKMB, CKMBINDEX, TROPONINI in the last 168 hours. BNP: Invalid input(s): POCBNP CBG: No results for input(s): GLUCAP in the last 168 hours. D-Dimer No results for input(s): DDIMER in the last 72 hours. Hgb A1c No results for input(s): HGBA1C in the  last 72 hours. Lipid Profile No results for input(s): CHOL, HDL, LDLCALC, TRIG, CHOLHDL, LDLDIRECT in the last 72 hours. Thyroid function studies No results for input(s): TSH, T4TOTAL, T3FREE, THYROIDAB in the last 72 hours.  Invalid input(s): FREET3 Anemia work up Recent Labs    10/04/21 1122  VITAMINB12 890  FERRITIN 8*  TIBC 563*  IRON 274*  RETICCTPCT 3.3*   Microbiology Recent Results (from the past 240 hour(s))  Resp Panel by RT-PCR (Flu A&B, Covid) Nasopharyngeal Swab     Status: None   Collection Time: 10/03/21  8:31 PM   Specimen: Nasopharyngeal Swab; Nasopharyngeal(NP) swabs in vial transport medium  Result Value Ref Range Status   SARS Coronavirus 2 by RT PCR NEGATIVE NEGATIVE Final    Comment: (NOTE) SARS-CoV-2 target nucleic acids are NOT DETECTED.  The SARS-CoV-2 RNA is generally detectable in upper respiratory specimens during the acute phase of infection. The lowest concentration of SARS-CoV-2 viral copies this assay can detect is 138 copies/mL. A negative result does not preclude SARS-Cov-2 infection and should not be used as the sole basis for treatment or other patient management decisions. A negative result may occur with  improper specimen collection/handling, submission of specimen other than nasopharyngeal swab, presence of viral  mutation(s) within the areas targeted by this assay, and inadequate number of viral copies(<138 copies/mL). A negative result must be combined with clinical observations, patient history, and epidemiological information. The expected result is Negative.  Fact Sheet for Patients:  BloggerCourse.com  Fact Sheet for Healthcare Providers:  SeriousBroker.it  This test is no t yet approved or cleared by the [place] FDA and  has been authorized for detection and/or diagnosis of SARS-CoV-2 by FDA under an Emergency Use Authorization (EUA). This EUA will remain  in effect (meaning this test can be used) for the duration of the COVID-19 declaration under Section 564(b)(1) of the Act, 21 U.S.C.section 360bbb-3(b)(1), unless the authorization is terminated  or revoked sooner.       Influenza A by PCR NEGATIVE NEGATIVE Final   Influenza B by PCR NEGATIVE NEGATIVE Final    Comment: (NOTE) The Xpert Xpress SARS-CoV-2/FLU/RSV plus assay is intended as an aid in the diagnosis of influenza from Nasopharyngeal swab specimens and should not be used as a sole basis for treatment. Nasal washings and aspirates are unacceptable for Xpert Xpress SARS-CoV-2/FLU/RSV testing.  Fact Sheet for Patients: BloggerCourse.com  Fact Sheet for Healthcare Providers: SeriousBroker.it  This test is not yet approved or cleared by the [place] FDA and has been authorized for detection and/or diagnosis of SARS-CoV-2 by FDA under an Emergency Use Authorization (EUA). This EUA will remain in effect (meaning this test can be used) for the duration of the COVID-19 declaration under Section 564(b)(1) of the Act, 21 U.S.C. section 360bbb-3(b)(1), unless the authorization is terminated or revoked.  Performed at [facility], [address], [place], [place] [address]      Signed: Lorin Glass  [facility] 10/04/2021, 1:28 PM

## 2021-10-05 LAB — TYPE AND SCREEN
ABO/RH(D): A NEG
Antibody Screen: NEGATIVE
Unit division: 0
Unit division: 0

## 2021-10-05 LAB — BPAM RBC
Blood Product Expiration Date: 202211262359
Blood Product Expiration Date: 202211282359
ISSUE DATE / TIME: 202211190300
ISSUE DATE / TIME: 202211190608
Unit Type and Rh: 600
Unit Type and Rh: 600

## 2021-10-06 DIAGNOSIS — Z9289 Personal history of other medical treatment: Secondary | ICD-10-CM

## 2021-10-06 HISTORY — DX: Personal history of other medical treatment: Z92.89

## 2021-10-20 ENCOUNTER — Ambulatory Visit: Payer: Managed Care, Other (non HMO) | Admitting: Obstetrics & Gynecology

## 2021-12-08 ENCOUNTER — Encounter: Payer: Self-pay | Admitting: Obstetrics & Gynecology

## 2021-12-08 ENCOUNTER — Other Ambulatory Visit (HOSPITAL_COMMUNITY)
Admission: RE | Admit: 2021-12-08 | Discharge: 2021-12-08 | Disposition: A | Discharge status: Home / Self Care | Payer: Managed Care, Other (non HMO) | Source: Ambulatory Visit | Attending: Obstetrics & Gynecology | Admitting: Obstetrics & Gynecology

## 2021-12-08 ENCOUNTER — Other Ambulatory Visit: Payer: Self-pay

## 2021-12-08 ENCOUNTER — Ambulatory Visit (INDEPENDENT_AMBULATORY_CARE_PROVIDER_SITE_OTHER): Payer: Managed Care, Other (non HMO) | Admitting: Obstetrics & Gynecology

## 2021-12-08 VITALS — BP 122/64 | HR 68 | Ht 59.0 in | Wt 132.0 lb

## 2021-12-08 DIAGNOSIS — M8589 Other specified disorders of bone density and structure, multiple sites: Secondary | ICD-10-CM | POA: Diagnosis not present

## 2021-12-08 DIAGNOSIS — M810 Age-related osteoporosis without current pathological fracture: Secondary | ICD-10-CM

## 2021-12-08 DIAGNOSIS — Z79890 Hormone replacement therapy (postmenopausal): Secondary | ICD-10-CM

## 2021-12-08 DIAGNOSIS — Z1272 Encounter for screening for malignant neoplasm of vagina: Secondary | ICD-10-CM | POA: Insufficient documentation

## 2021-12-08 DIAGNOSIS — Z01419 Encounter for gynecological examination (general) (routine) without abnormal findings: Secondary | ICD-10-CM | POA: Diagnosis not present

## 2021-12-08 DIAGNOSIS — Z9189 Other specified personal risk factors, not elsewhere classified: Secondary | ICD-10-CM

## 2021-12-08 NOTE — Progress Notes (Signed)
Donna Baker [birth year] [id number]   History:    66 y.o.  G3P2A1L2 Boyfriend of 22 years   RP:  Established patient presenting for annual gyn exam    HPI: S/P TAH for Fibroids.  Menopause on Estradiol x 08/2012.  Well on Vivelle 0.025 patch.  No pelvic pain.  Pap Neg 09/2017.  Pap reflex today. Urine/BMs wnl.  Breasts normal.  Mammo Neg 06/2021.  BMI improved to 26.66. Good fitness and Low calorie diet now.  Fasting Health labs with Fam MD.  H/O Hypercholesterolemia, not on treatment currently.  [place] 10/2018.  BD on 02/06/20 showed Osteopenia with T-Score at -1.6.  Past medical history,surgical history, family history and social history were all reviewed and documented in the EPIC chart.  Gynecologic History No LMP recorded. Patient has had a hysterectomy.  Obstetric History OB History  Gravida Para Term Preterm AB Living  3 2     1 2   SAB IAB Ectopic Multiple Live Births  1            # Outcome Date GA Lbr Len/2nd Weight Sex Delivery Anes PTL Lv  3 SAB           2 Para           1 Para              ROS: A ROS was performed and pertinent positives and negatives are included in the history.  GENERAL: No fevers or chills. HEENT: No change in vision, no earache, sore throat or sinus congestion. NECK: No pain or stiffness. CARDIOVASCULAR: No chest pain or pressure. No palpitations. PULMONARY: No shortness of breath, cough or wheeze. GASTROINTESTINAL: No abdominal pain, nausea, vomiting or diarrhea, melena or bright red blood per rectum. GENITOURINARY: No urinary frequency, urgency, hesitancy or dysuria. MUSCULOSKELETAL: No joint or muscle pain, no back pain, no recent trauma. DERMATOLOGIC: No rash, no itching, no lesions. ENDOCRINE: No polyuria, polydipsia, no heat or cold intolerance. No recent change in weight. HEMATOLOGICAL: No anemia or easy bruising or bleeding. NEUROLOGIC: No headache, seizures, numbness, tingling or weakness. PSYCHIATRIC: No depression, no loss of interest in  normal activity or change in sleep pattern.     Exam:   BP 122/64    Pulse 68    Ht 4\' 11"  (1.499 m)    Wt 132 lb (59.9 kg)    SpO2 100%    BMI 26.66 kg/m   Body mass index is 26.66 kg/m.  General appearance : Well developed well nourished female. No acute distress HEENT: Eyes: no retinal hemorrhage or exudates,  Neck supple, trachea midline, no carotid bruits, no thyroidmegaly Lungs: Clear to auscultation, no rhonchi or wheezes, or rib retractions  Heart: Regular rate and rhythm, no murmurs or gallops Breast:Examined in sitting and supine position were symmetrical in appearance, no palpable masses or tenderness,  no skin retraction, no nipple inversion, no nipple discharge, no skin discoloration, no axillary or supraclavicular lymphadenopathy Abdomen: no palpable masses or tenderness, no rebound or guarding Extremities: no edema or skin discoloration or tenderness  Pelvic: Vulva: Normal             Vagina: No gross lesions or discharge.  Pap reflex done.  Cervix/Uterus absent  Adnexa  Without masses or tenderness  Anus: Normal   Assessment/Plan:  66 y.o. female for annual exam   1. Encounter for Papanicolaou smear of vagina as part of routine gynecological examination S/P TAH for Fibroids.  Menopause on Estradiol x  08/2012.  Well on Vivelle 0.025 patch.  No pelvic pain.  Pap Neg 09/2017.  Pap reflex today. Urine/BMs wnl.  Breasts normal.  Mammo Neg 06/2021.  BMI improved to 26.66. Good fitness and Low calorie diet now.  Fasting Health labs with Fam MD.  H/O Hypercholesterolemia, not on treatment currently.  Colono 10/2018.  BD on 02/06/20 showed Osteopenia with T-Score at -1.6. - Cytology - PAP( [place])  2. At risk of fracture due to osteopenia  3. Post-menopause on HRT (hormone replacement therapy) S/P TAH for Fibroids.  Menopause on Estradiol x 08/2012.  Well on Vivelle 0.025 patch.  No pelvic pain.  Decision to wean and stop HRT after 10 yrs, now 66 yo with increased  Cholesterol.  Will cut the 0.025 patch in half and stop within 4 months. - DG Bone Density; Future  4. Osteopenia of multiple sites BD on 02/06/20 showed Osteopenia with T-Score at -1.6.  Repeat BD in 01/2022.  Vit D supplement, Ca++ 1.5 g/d total and Regular wt bearing physical activities. - DG Bone Density; Future   Genia Del MD, 8:44 AM 12/08/2021

## 2021-12-09 LAB — CYTOLOGY - PAP: Diagnosis: NEGATIVE

## 2022-02-09 ENCOUNTER — Encounter: Payer: Self-pay | Admitting: Obstetrics & Gynecology

## 2022-02-10 NOTE — Telephone Encounter (Signed)
FYI. Pt scheduled for 03/11/22.  ?

## 2022-03-11 ENCOUNTER — Encounter: Payer: Self-pay | Admitting: Obstetrics & Gynecology

## 2022-03-11 ENCOUNTER — Ambulatory Visit (INDEPENDENT_AMBULATORY_CARE_PROVIDER_SITE_OTHER): Payer: Managed Care, Other (non HMO) | Admitting: Obstetrics & Gynecology

## 2022-03-11 VITALS — BP 120/80

## 2022-03-11 DIAGNOSIS — M8589 Other specified disorders of bone density and structure, multiple sites: Secondary | ICD-10-CM | POA: Diagnosis not present

## 2022-03-11 DIAGNOSIS — N951 Menopausal and female climacteric states: Secondary | ICD-10-CM | POA: Diagnosis not present

## 2022-03-11 MED ORDER — ESTRADIOL 0.025 MG/24HR TD PTTW: 1.0000 | MEDICATED_PATCH | TRANSDERMAL | 4 refills | Status: DC

## 2022-03-11 NOTE — Progress Notes (Signed)
? ? ?Donna Baker 1956/01/29 [id number] ? ? ?     66 y.o.  [id number]  ? ?RP: Persistent Menopausal Syndrome ? ?HPI: Persistent Menopausal Syndrome with severe hot flushes and night sweats preventing a good night sleep.  Stopped HRT 2 months ago and had to start [place] a month ago, which is not sufficient to control the Sxs.  Would like to restart on HRT.  Also starting to experience vaginal dryness and her moods are going up and down.  At [facility] visit in 11/2021 we noted:  S/P TAH for Fibroids.  Menopause on Estradiol x 08/2012.  Well on Vivelle 0.025 patch.  No pelvic pain.  Pap reflex today. Breasts normal.  Mammo Neg 06/2021.  BMI improved to 26.66. Good fitness. Fasting Health labs with Fam MD.  H/O Hypercholesterolemia, now on Lipitor and Pre DM on Metformin.  BD on 02/06/20 showed Osteopenia with T-Score at -1.6. ? ? ?OB History  ?Gravida Para Term Preterm AB Living  ?3 2     1 2   ?SAB IAB Ectopic Multiple Live Births  ?1          ?  ?# Outcome Date GA Lbr Len/2nd Weight Sex Delivery Anes PTL Lv  ?3 SAB           ?2 Para           ?1 Para           ? ? ?Past medical history,surgical history, problem list, medications, allergies, family history and social history were all reviewed and documented in the EPIC chart. ? ? ?Directed ROS with pertinent positives and negatives documented in the history of present illness/assessment and plan. ? ?Exam: ? ?Vitals:  ? 03/11/22 1433  ?BP: 120/80  ? ?General appearance:  Normal ? ?Gynecologic exam: Deferred ? ? ?Assessment/Plan:  66 y.o. 78  ? ?1. Menopausal syndrome ?Persistent Menopausal Syndrome with severe hot flushes and night sweats preventing a good night sleep.  Stopped HRT 2 months ago and had to start [place] a month ago, which is not sufficient to control the Sxs.  Would like to restart on HRT.  Also starting to experience vaginal dryness and her moods are going up and down.  At [facility] visit in 11/2021 we noted:  S/P TAH for Fibroids.  Menopause on  Estradiol x 08/2012.  Well on Vivelle 0.025 patch.  No pelvic pain.  Pap reflex today. Breasts normal.  Mammo Neg 06/2021.  BMI improved to 26.66. Good fitness. Fasting Health labs with Fam MD.  H/O Hypercholesterolemia, now on Lipitor and Pre DM on Metformin.  BD on 02/06/20 showed Osteopenia with T-Score at -1.6. ?Counseling on the risks vs benefits of HRT at age 3 with patient's medical history thoroughly reviewed.  Patient understand the increased risks of Breast Cancer and Stroke/Blood clots.  Given the severity of her symptoms, she requests a low dose Estradiol patch treatment.  Decision to restart on the Estradiol patch 0.025 twice a week.  S/P TAH, so Progesterone not needed.  If this dosage is not sufficient, will start patient on a low dose Anti-Depressant rather than increasing the dosage of Estradiol. ? ?2. Osteopenia of multiple sites ?BD on 02/06/20 showed Osteopenia with T-Score at -1.6. ? ?Other orders ?- Ferrous Sulfate (IRON PO); Take by mouth. ?- Nutritional Supplements (ESTROVEN PO); Take by mouth. ?- estradiol (VIVELLE-DOT) 0.025 MG/24HR; Place 1 patch onto the skin 2 (two) times a week.  ? ?02/08/20 MD, 2:56 PM 03/11/2022 ? ? ? ?  ?

## 2022-06-02 ENCOUNTER — Other Ambulatory Visit: Payer: Self-pay | Admitting: Obstetrics & Gynecology

## 2022-06-02 DIAGNOSIS — Z1231 Encounter for screening mammogram for malignant neoplasm of breast: Secondary | ICD-10-CM

## 2022-07-09 ENCOUNTER — Ambulatory Visit: Payer: Managed Care, Other (non HMO)

## 2022-07-24 ENCOUNTER — Ambulatory Visit: Payer: Managed Care, Other (non HMO)

## 2022-08-05 ENCOUNTER — Ambulatory Visit: Payer: Managed Care, Other (non HMO)

## 2022-08-20 ENCOUNTER — Ambulatory Visit
Admission: RE | Admit: 2022-08-20 | Discharge: 2022-08-20 | Disposition: A | Discharge status: Home / Self Care | Payer: Managed Care, Other (non HMO) | Source: Ambulatory Visit | Attending: Obstetrics & Gynecology | Admitting: Obstetrics & Gynecology

## 2022-08-20 DIAGNOSIS — Z1231 Encounter for screening mammogram for malignant neoplasm of breast: Secondary | ICD-10-CM

## 2022-09-14 ENCOUNTER — Other Ambulatory Visit: Payer: Self-pay | Admitting: Obstetrics & Gynecology

## 2022-09-15 NOTE — Telephone Encounter (Signed)
Medication refill request: estradiol  Last AEX:  01/15/20 Next AEX: none  Last MMG (if hormonal medication request): 08/20/22 Refill authorized: #[id number] with 0 RF

## 2022-10-12 ENCOUNTER — Ambulatory Visit: Payer: Self-pay | Admitting: General Surgery

## 2022-10-12 NOTE — H&P (Signed)
PROVIDER:  Elenora Gamma, MD   MRN: [id number] DOB: 1956/02/05 DATE OF ENCOUNTER: 10/12/2022   Subjective   Chief Complaint: Follow-up (2 month f/u hemorrhoids/)       History of Present Illness: Donna Baker is a 66 y.o. female who is seen today as an office consultation at the request of Dr. Seymour Bars for evaluation of Follow-up (2 month f/u hemorrhoids/) .   Patient with a longstanding history of irregular bowel habits and history of hemorrhoidectomy by Dr. Earlene Plater in the 90s.  She reports the past few months she is having worsening bleeding from her hemorrhoids.  She underwent a colonoscopy and EGD due to anemia.  No other sources were found.  She states that she has a bowel movement every day but sits on the toilet for 30 to 40 minutes at a time.  She reports bleeding intermittently.  She is taking an iron supplement.  She has tried Linzess but is currently off of this.  She has tried MiraLAX but that causes her to have multiple bowel movements per day.  Over the past few months she has been using Benefiber.  She has not noticed a significant difference.  She continues to have multiple bowel movements a day.  She is trying to stay off the toilet for long periods of time.  She does not feel that her suppositories have helped much either.  She recently was seen by her primary care physician and found to have anemia once again.     Review of Systems: A complete review of systems was obtained from the patient.  I have reviewed this information and discussed as appropriate with the patient.  See HPI as well for other ROS.     Medical History: Past Medical History Past Medical History: Diagnosis Date  Anemia    Hyperlipidemia        There is no problem list on file for this patient.     Past Surgical History Past Surgical History: Procedure Laterality Date  knee surgery      shoulder surgery          Allergies Allergies Allergen Reactions  Sulfa (Sulfonamide  Antibiotics) Other (See Comments)     Unknown      Current Outpatient Medications on File Prior to Visit Medication Sig Dispense Refill  atorvastatin (LIPITOR) 10 MG tablet Take 10 mg by mouth once daily      ferrous fumarate-vitamin C (VITRON-C) 65 mg iron- 125 mg tablet 1 tablet      metFORMIN (GLUCOPHAGE-XR) 500 MG XR tablet 1 tablet with evening meal      multivitamin tablet Take 1 tablet by mouth once daily      UNABLE TO FIND CBD       No current facility-administered medications on file prior to visit.     Family History Family History Problem Relation Age of Onset  Diabetes Sister        Social History   Tobacco Use Smoking Status Former  Types: Cigarettes  Quit date: 1980  Years since quitting: 43.9 Smokeless Tobacco Never     Social History Social History    Socioeconomic History  Marital status: Divorced Tobacco Use  Smoking status: Former     Types: Cigarettes     Quit date: 1980     Years since quitting: 43.9  Smokeless tobacco: Never      Objective:     There were no vitals filed for this visit.     Exam Gen: NAD  Abd: soft Rectal: Circumferential skin tags noted, good sphincter tone     Labs, Imaging and Diagnostic Testing:   Procedure: Anoscopy Surgeon: Maisie Fus After the risks and benefits were explained, written consent was obtained for above procedure.  A medical assistant chaperone was present thoroughout the entire procedure.  Anesthesia: none Diagnosis: Rectal bleeding Findings: Grade 2 hemorrhoids at all 3 locations with moderate inflammation, circumferential external tags noted   Assessment and Plan: Diagnoses and all orders for this visit:   Grade II hemorrhoids   66 year old female status post hemorrhoidectomy in the 90s who presents to the office with continued rectal bleeding.  On exam today she does have more inflammation of her internal hemorrhoids and is still having trouble with anemia despite optimal medical  management of her bowel habits.  I think she may benefit from trans hemorrhoidal dearterialization.  He has a 97% success rate with rectal bleeding.  We will plan on proceeding with this in the near future.  All questions were answered.  Risks include bleeding, pain and recurrence.           Donna Panda, MD Colon and Rectal Surgery [facility]

## 2022-10-15 ENCOUNTER — Non-Acute Institutional Stay (HOSPITAL_COMMUNITY)
Admission: RE | Admit: 2022-10-15 | Discharge: 2022-10-15 | Disposition: A | Discharge status: Home / Self Care | Payer: Managed Care, Other (non HMO) | Source: Ambulatory Visit | Attending: Internal Medicine | Admitting: Internal Medicine

## 2022-10-15 DIAGNOSIS — D500 Iron deficiency anemia secondary to blood loss (chronic): Secondary | ICD-10-CM | POA: Diagnosis present

## 2022-10-15 MED ORDER — SODIUM CHLORIDE 0.9 % IV SOLN: INTRAVENOUS | Status: DC | PRN

## 2022-10-15 MED ORDER — SODIUM CHLORIDE 0.9 % IV SOLN
510.0000 mg | Freq: Once | INTRAVENOUS | Status: AC
  Administered 2022-10-15: 510 mg via INTRAVENOUS
  Filled 2022-10-15: qty 510

## 2022-10-15 NOTE — Progress Notes (Signed)
PATIENT CARE CENTER NOTE  Diagnosis: Anemia due to chronic blood loss  Provider: Selena Batten MD  Procedure: Feraheme 510 mg  Note: Patient received IV Feraheme infusion (dose #1 of 2) via PIV. Tolerated infusion well with no adverse reaction. Vital signs stable. Discharge instructions given. Patient observed for 30 minutes post infusion. Patient advised to schedule next appointment at front desk. Alert, oriented and ambulatory at discharge.

## 2022-10-19 ENCOUNTER — Non-Acute Institutional Stay (HOSPITAL_COMMUNITY)
Admission: RE | Admit: 2022-10-19 | Discharge: 2022-10-19 | Disposition: A | Discharge status: Home / Self Care | Payer: Managed Care, Other (non HMO) | Source: Ambulatory Visit | Attending: Internal Medicine | Admitting: Internal Medicine

## 2022-10-19 DIAGNOSIS — D500 Iron deficiency anemia secondary to blood loss (chronic): Secondary | ICD-10-CM | POA: Diagnosis present

## 2022-10-19 MED ORDER — SODIUM CHLORIDE 0.9 % IV SOLN: INTRAVENOUS | Status: DC | PRN

## 2022-10-19 MED ORDER — SODIUM CHLORIDE 0.9 % IV SOLN
510.0000 mg | Freq: Once | INTRAVENOUS | Status: AC
  Administered 2022-10-19: 510 mg via INTRAVENOUS
  Filled 2022-10-19: qty 17

## 2022-10-19 NOTE — Progress Notes (Signed)
PATIENT CARE CENTER NOTE  Diagnosis: Anemia due to chronic blood loss    Provider: Selena Batten MD  Procedure: Feraheme 510 mg  Note: Patient received Feraheme 510 mg infusion (dose #2 of 2) via PIV. Tolerated infusion well with no adverse reaction. Vital signs stable. AVS offered, but pt declined. Patient observed 30 minutes post infusion. Pt alert, oriented and ambulatory at discharge.

## 2022-11-12 ENCOUNTER — Encounter (HOSPITAL_BASED_OUTPATIENT_CLINIC_OR_DEPARTMENT_OTHER): Payer: Self-pay | Admitting: General Surgery

## 2022-11-12 ENCOUNTER — Other Ambulatory Visit: Payer: Self-pay

## 2022-11-12 NOTE — Progress Notes (Addendum)
Spoke w/ via phone for pre-op interview---[facility] needs dos---- ISTAT (hx of anemia and recent iron infusions)              Lab results------none COVID test -----patient states asymptomatic no test needed Arrive at -------0800 on Thursday, 11/19/2022 NPO after MN NO Solid Food.  Clear liquids from MN until---0700 Med rec completed Medications to take morning of surgery -----Lipitor, Omeprazole prn, Vivelle dot Diabetic medication -----Do not take Metformin on the morning of surgery. Patient instructed no nail polish to be worn day of surgery Patient instructed to bring photo id and insurance card day of surgery Patient aware to have Driver (ride ) / caregiver    for 24 hours after surgery friend, John Patient Special Instructions -----none Pre-Op special Istructions -----none Patient verbalized understanding of instructions that were given at this phone interview. Patient denies shortness of breath, chest pain, fever, cough at this phone interview.   Patient stated that she was exposed to the flu on 11/10/22. Patient stated that she now has a mild sore throat and cough. She was prescribed Tamiflu without being tested for the flu due to recent know exposure. I instructed patient to call her surgeon today, 11/12/22 and let them know of her symptoms and exposure. She agreed that she would call Dr. Maisie Fus today.

## 2022-11-19 DIAGNOSIS — Z01818 Encounter for other preprocedural examination: Secondary | ICD-10-CM

## 2022-12-14 ENCOUNTER — Encounter (HOSPITAL_BASED_OUTPATIENT_CLINIC_OR_DEPARTMENT_OTHER): Payer: Self-pay | Admitting: General Surgery

## 2022-12-14 NOTE — Progress Notes (Signed)
Spoke w/ via phone for pre-op interview--- [employer]----   ISTAT            Lab results------ COVID test -----patient states asymptomatic no test needed Arrive at -------0630 NPO after MN NO Solid Food.   Med rec completed Medications to take morning of surgery -----NONE. Vivelle dot in place Diabetic medication ----- Patient instructed no nail polish to be worn day of surgery Patient instructed to bring photo id and insurance card day of surgery Patient aware to have Driver (ride ) / caregiver  Jadene Pierini  for 24 hours after surgery  Patient Special Instructions ----- Pre-Op special Istructions ----- Patient verbalized understanding of instructions that were given at this phone interview. Patient denies shortness of breath, chest pain, fever, cough at this phone interview.

## 2022-12-18 ENCOUNTER — Encounter (HOSPITAL_BASED_OUTPATIENT_CLINIC_OR_DEPARTMENT_OTHER)
Admission: RE | Disposition: A | Discharge status: Home / Self Care | Payer: Self-pay | Source: Home / Self Care | Attending: General Surgery

## 2022-12-18 ENCOUNTER — Ambulatory Visit (HOSPITAL_BASED_OUTPATIENT_CLINIC_OR_DEPARTMENT_OTHER)
Admission: RE | Admit: 2022-12-18 | Discharge: 2022-12-18 | Disposition: A | Discharge status: Home / Self Care | Payer: Managed Care, Other (non HMO) | Attending: General Surgery | Admitting: General Surgery

## 2022-12-18 ENCOUNTER — Other Ambulatory Visit: Payer: Self-pay

## 2022-12-18 ENCOUNTER — Encounter (HOSPITAL_BASED_OUTPATIENT_CLINIC_OR_DEPARTMENT_OTHER): Payer: Self-pay | Admitting: General Surgery

## 2022-12-18 ENCOUNTER — Ambulatory Visit (HOSPITAL_BASED_OUTPATIENT_CLINIC_OR_DEPARTMENT_OTHER): Payer: Managed Care, Other (non HMO) | Admitting: Physician Assistant

## 2022-12-18 DIAGNOSIS — Z87891 Personal history of nicotine dependence: Secondary | ICD-10-CM

## 2022-12-18 DIAGNOSIS — K625 Hemorrhage of anus and rectum: Secondary | ICD-10-CM | POA: Diagnosis present

## 2022-12-18 DIAGNOSIS — D649 Anemia, unspecified: Secondary | ICD-10-CM | POA: Insufficient documentation

## 2022-12-18 DIAGNOSIS — K641 Second degree hemorrhoids: Secondary | ICD-10-CM | POA: Diagnosis not present

## 2022-12-18 DIAGNOSIS — Z01818 Encounter for other preprocedural examination: Secondary | ICD-10-CM

## 2022-12-18 HISTORY — DX: Prediabetes: R73.03

## 2022-12-18 HISTORY — DX: Anemia, unspecified: D64.9

## 2022-12-18 HISTORY — DX: Gastro-esophageal reflux disease without esophagitis: K21.9

## 2022-12-18 HISTORY — PX: TRANSANAL HEMORRHOIDAL DEARTERIALIZATION: SHX6136

## 2022-12-18 LAB — POCT I-STAT, CHEM 8
BUN: 16 mg/dL (ref 8–23)
Calcium, Ion: 1.28 mmol/L (ref 1.15–1.40)
Chloride: 107 mmol/L (ref 98–111)
Creatinine, Ser: 0.6 mg/dL (ref 0.44–1.00)
Glucose, Bld: 106 mg/dL — ABNORMAL HIGH (ref 70–99)
HCT: 35 % — ABNORMAL LOW (ref 36.0–46.0)
Hemoglobin: 11.9 g/dL — ABNORMAL LOW (ref 12.0–15.0)
Potassium: 3.6 mmol/L (ref 3.5–5.1)
Sodium: 140 mmol/L (ref 135–145)
TCO2: 23 mmol/L (ref 22–32)

## 2022-12-18 SURGERY — TRANSANAL HEMORRHOIDAL DEARTERIALIZATION
Anesthesia: Monitor Anesthesia Care | Site: Rectum

## 2022-12-18 MED ORDER — OXYCODONE HCL 5 MG PO TABS: 5.0000 mg | ORAL_TABLET | Freq: Four times a day (QID) | ORAL | 0 refills | Status: DC | PRN

## 2022-12-18 MED ORDER — FENTANYL CITRATE (PF) 100 MCG/2ML IJ SOLN: 25.0000 ug | INTRAMUSCULAR | Status: DC | PRN

## 2022-12-18 MED ORDER — PROPOFOL 10 MG/ML IV BOLUS
INTRAVENOUS | Status: DC | PRN
  Administered 2022-12-18: 40 mg via INTRAVENOUS

## 2022-12-18 MED ORDER — ONDANSETRON HCL 4 MG/2ML IJ SOLN: 4.0000 mg | Freq: Four times a day (QID) | INTRAMUSCULAR | Status: DC | PRN

## 2022-12-18 MED ORDER — KETAMINE HCL 50 MG/5ML IJ SOSY
PREFILLED_SYRINGE | INTRAMUSCULAR | Status: AC
  Filled 2022-12-18: qty 5

## 2022-12-18 MED ORDER — FENTANYL CITRATE (PF) 100 MCG/2ML IJ SOLN
INTRAMUSCULAR | Status: DC | PRN
  Administered 2022-12-18: 50 ug via INTRAVENOUS

## 2022-12-18 MED ORDER — BUPIVACAINE-EPINEPHRINE (PF) 0.5% -1:200000 IJ SOLN
INTRAMUSCULAR | Status: DC | PRN
  Administered 2022-12-18: 50 mL

## 2022-12-18 MED ORDER — SODIUM CHLORIDE 0.9% FLUSH: 3.0000 mL | Freq: Two times a day (BID) | INTRAVENOUS | Status: DC

## 2022-12-18 MED ORDER — MIDAZOLAM HCL 2 MG/2ML IJ SOLN
INTRAMUSCULAR | Status: DC | PRN
  Administered 2022-12-18: 1 mg via INTRAVENOUS

## 2022-12-18 MED ORDER — 0.9 % SODIUM CHLORIDE (POUR BTL) OPTIME
TOPICAL | Status: DC | PRN
  Administered 2022-12-18: 500 mL

## 2022-12-18 MED ORDER — GABAPENTIN 300 MG PO CAPS
ORAL_CAPSULE | ORAL | Status: AC
  Filled 2022-12-18: qty 1

## 2022-12-18 MED ORDER — KETAMINE HCL 10 MG/ML IJ SOLN
INTRAMUSCULAR | Status: DC | PRN
  Administered 2022-12-18 (×5): 10 mg via INTRAVENOUS

## 2022-12-18 MED ORDER — FENTANYL CITRATE (PF) 100 MCG/2ML IJ SOLN
INTRAMUSCULAR | Status: AC
  Filled 2022-12-18: qty 2

## 2022-12-18 MED ORDER — LACTATED RINGERS IV SOLN: INTRAVENOUS | Status: DC

## 2022-12-18 MED ORDER — ACETAMINOPHEN 500 MG PO TABS
1000.0000 mg | ORAL_TABLET | ORAL | Status: AC
  Administered 2022-12-18: 1000 mg via ORAL

## 2022-12-18 MED ORDER — PROPOFOL 500 MG/50ML IV EMUL
INTRAVENOUS | Status: DC | PRN
  Administered 2022-12-18: 150 ug/kg/min via INTRAVENOUS

## 2022-12-18 MED ORDER — BUPIVACAINE LIPOSOME 1.3 % IJ SUSP: 20.0000 mL | Freq: Once | INTRAMUSCULAR | Status: DC

## 2022-12-18 MED ORDER — GABAPENTIN 300 MG PO CAPS
300.0000 mg | ORAL_CAPSULE | ORAL | Status: AC
  Administered 2022-12-18: 300 mg via ORAL

## 2022-12-18 MED ORDER — ACETAMINOPHEN 500 MG PO TABS
ORAL_TABLET | ORAL | Status: AC
  Filled 2022-12-18: qty 2

## 2022-12-18 MED ORDER — OXYCODONE HCL 5 MG PO TABS: 5.0000 mg | ORAL_TABLET | Freq: Once | ORAL | Status: DC | PRN

## 2022-12-18 MED ORDER — MIDAZOLAM HCL 2 MG/2ML IJ SOLN
INTRAMUSCULAR | Status: AC
  Filled 2022-12-18: qty 2

## 2022-12-18 MED ORDER — OXYCODONE HCL 5 MG/5ML PO SOLN: 5.0000 mg | Freq: Once | ORAL | Status: DC | PRN

## 2022-12-18 SURGICAL SUPPLY — 34 items
BRIEF MESH DISP LRG (UNDERPADS AND DIAPERS) IMPLANT
COVER BACK TABLE 60X90IN (DRAPES) ×1 IMPLANT
DRAPE HYSTEROSCOPY (MISCELLANEOUS) ×1 IMPLANT
DRAPE SHEET LG 3/4 BI-LAMINATE (DRAPES) ×1 IMPLANT
ELECT REM PT RETURN 9FT ADLT (ELECTROSURGICAL) ×1
ELECTRODE REM PT RTRN 9FT ADLT (ELECTROSURGICAL) ×1 IMPLANT
GAUZE 4X4 16PLY ~~LOC~~+RFID DBL (SPONGE) ×1 IMPLANT
GAUZE PAD ABD 8X10 STRL (GAUZE/BANDAGES/DRESSINGS) IMPLANT
GAUZE SPONGE 4X4 12PLY STRL (GAUZE/BANDAGES/DRESSINGS) IMPLANT
GLOVE BIO SURGEON STRL SZ 6.5 (GLOVE) ×1 IMPLANT
GLOVE BIOGEL PI IND STRL 7.0 (GLOVE) ×1 IMPLANT
GLOVE INDICATOR 6.5 STRL GRN (GLOVE) ×1 IMPLANT
GOWN STRL REUS W/TWL XL LVL3 (GOWN DISPOSABLE) ×1 IMPLANT
HEMOSTAT SURGICEL 4X8 (HEMOSTASIS) IMPLANT
KIT SIGMOIDOSCOPE (SET/KITS/TRAYS/PACK) IMPLANT
KIT SLIDE ONE PROLAPS HEMORR (KITS) IMPLANT
KIT TURNOVER CYSTO (KITS) ×1 IMPLANT
LEGGING LITHOTOMY PAIR STRL (DRAPES) ×1 IMPLANT
LUBRICANT JELLY K Y 4OZ (MISCELLANEOUS) ×1 IMPLANT
NEEDLE HYPO 22GX1.5 SAFETY (NEEDLE) ×1 IMPLANT
NS IRRIG 500ML POUR BTL (IV SOLUTION) IMPLANT
PACK BASIN DAY SURGERY FS (CUSTOM PROCEDURE TRAY) ×1 IMPLANT
PAD ARMBOARD 7.5X6 YLW CONV (MISCELLANEOUS) IMPLANT
PENCIL SMOKE EVACUATOR (MISCELLANEOUS) ×1 IMPLANT
SPIKE FLUID TRANSFER (MISCELLANEOUS) IMPLANT
SPONGE HEMORRHOID 8X3CM (HEMOSTASIS) IMPLANT
SUT CHROMIC 2 0 SH (SUTURE) IMPLANT
SUT CHROMIC 3 0 SH 27 (SUTURE) IMPLANT
SUT VIC AB 2-0 UR6 27 (SUTURE) IMPLANT
SYR CONTROL 10ML LL (SYRINGE) ×1 IMPLANT
TOWEL OR 17X26 10 PK STRL BLUE (TOWEL DISPOSABLE) ×1 IMPLANT
TRAY DSU PREP LF (CUSTOM PROCEDURE TRAY) ×1 IMPLANT
TUBE CONNECTING 12X1/4 (SUCTIONS) ×1 IMPLANT
YANKAUER SUCT BULB TIP NO VENT (SUCTIONS) ×1 IMPLANT

## 2022-12-18 NOTE — Transfer of Care (Signed)
Immediate Anesthesia Transfer of Care Note  Patient: Donna Baker  Procedure(s) Performed: Procedure(s) (LRB): TRANSANAL HEMORRHOIDAL DEARTERIALIZATION (N/A)  Patient Location: PACU  Anesthesia Type: MAC  Level of Consciousness: awake, alert , oriented and patient cooperative  Airway & Oxygen Therapy: Patient Spontanous Breathing and Patient connected to face mask oxygen  Post-op Assessment: Report given to PACU RN and Post -op Vital signs reviewed and stable  Post vital signs: Reviewed and stable  Complications: No apparent anesthesia complications

## 2022-12-18 NOTE — H&P (Signed)
PROVIDER:  Monico Blitz, MD   MRN: [id number] DOB: 1956/01/30    Subjective   Chief Complaint: Follow-up (2 month f/u hemorrhoids/)       History of Present Illness: Donna Baker is a 67 y.o. female who is seen today as an office consultation at the request of Dr. Pasty Arch for evaluation of Follow-up (2 month f/u hemorrhoids/) .   Patient with a longstanding history of irregular bowel habits and history of hemorrhoidectomy by Dr. Rosana Hoes in the 90s.  She reports the past few months she is having worsening bleeding from her hemorrhoids.  She underwent a colonoscopy and EGD due to anemia.  No other sources were found.  She states that she has a bowel movement every day but sits on the toilet for 30 to 40 minutes at a time.  She reports bleeding intermittently.  She is taking an iron supplement.  She has tried Linzess but is currently off of this.  She has tried MiraLAX but that causes her to have multiple bowel movements per day.  Over the past few months she has been using Benefiber.  She has not noticed a significant difference.  She continues to have multiple bowel movements a day.  She is trying to stay off the toilet for long periods of time.  She does not feel that her suppositories have helped much either.  She recently was seen by her primary care physician and found to have anemia once again.     Review of Systems: A complete review of systems was obtained from the patient.  I have reviewed this information and discussed as appropriate with the patient.  See HPI as well for other ROS.     Medical History: Past Medical History Past Medical History: Diagnosis        Date            Anemia                         Hyperlipidemia                    There is no problem list on file for this patient.     Past Surgical History Past Surgical History: Procedure       Laterality         Date            knee surgery                            shoulder surgery                              Allergies Allergies Allergen           Reactions            Sulfa (Sulfonamide Antibiotics)          Other (See Comments)                         Unknown       Current Outpatient Medications on File Prior to Visit Medication       Sig       Dispense         Refill            atorvastatin (LIPITOR) 10 MG tablet  Take 10 mg  by mouth once daily                                ferrous fumarate-vitamin C (VITRON-C) 65 mg iron- 125 mg tablet 1 tablet                                     metFORMIN (GLUCOPHAGE-XR) 500 MG XR tablet          1 tablet with evening meal                            multivitamin tablet       Take 1 tablet by mouth once daily                               UNABLE TO FIND      CBD                     No current facility-administered medications on file prior to visit.     Family History Family History Problem           Relation           Age of Onset            Diabetes          Sister           Social History   Tobacco Use Smoking Status           Former            Types: Cigarettes            Quit date:        1980            Years since quitting:   43.9 Smokeless Tobacco   Never     Social History Social History     Socioeconomic History            Marital status:  Divorced Tobacco Use            Smoking status:          Former                         Types: Cigarettes                         Quit date:        1980                         Years since quitting:   43.9            Smokeless tobacco:    Never       Objective:     BP 129/83   Pulse 73   Temp 97.9 F (36.6 C) (Oral)   Resp 16   Ht 5' (1.524 m)   Wt 61.2 kg   SpO2 99%   BMI 26.37 kg/m       Exam Gen: NAD Abd: soft Rectal: Circumferential skin tags noted, good sphincter tone     Labs, Imaging and Diagnostic Testing:   Procedure: Anoscopy 10/12/2022 Surgeon: Marcello Moores After the  risks and benefits were explained, written consent was obtained for above  procedure.  A medical assistant chaperone was present thoroughout the entire procedure.  Anesthesia: none Diagnosis: Rectal bleeding Findings: Grade 2 hemorrhoids at all 3 locations with moderate inflammation, circumferential external tags noted   Assessment and Plan: Diagnoses and all orders for this visit:   Grade II hemorrhoids   67 year old female status post hemorrhoidectomy in the 90s who presents to the office with continued rectal bleeding.  On exam today she does have more inflammation of her internal hemorrhoids and is still having trouble with anemia despite optimal medical management of her bowel habits.  I think she may benefit from trans hemorrhoidal dearterialization.  He has a 97% success rate with rectal bleeding.  We will plan on proceeding with this in the near future.  All questions were answered.  Risks include bleeding, pain and recurrence.           Donna Adie, MD Colon and Rectal Surgery [facility]

## 2022-12-18 NOTE — Anesthesia Procedure Notes (Signed)
Procedure Name: MAC Date/Time: 12/18/2022 11:00 AM  Performed by: Suan Halter, CRNAPre-anesthesia Checklist: Patient identified, Emergency Drugs available, Suction available and Patient being monitored Patient Re-evaluated:Patient Re-evaluated prior to induction Oxygen Delivery Method: Simple face mask

## 2022-12-18 NOTE — Op Note (Signed)
12/18/2022  11:50 AM  PATIENT:  Donna Baker  67 y.o. female  Patient Care Team: Cari Caraway, MD as PCP - General (Family Medicine)  PRE-OPERATIVE DIAGNOSIS:  RECTAL BLEEDING  POST-OPERATIVE DIAGNOSIS:  RECTAL BLEEDING  PROCEDURE:  TRANSANAL HEMORRHOIDAL DEARTERIALIZATION   Surgeon(s): Leighton Ruff, MD  ASSISTANT: none   ANESTHESIA:   local and MAC  EBL:  Total I/O In: -  Out: 5 [Blood:5]  DRAINS: none   SPECIMEN:  No Specimen  DISPOSITION OF SPECIMEN:  N/A  COUNTS:  YES  PLAN OF CARE: Discharge to home after PACU  PATIENT DISPOSITION:  PACU - hemodynamically stable.  INDICATION: 67 y.o. F with rectal bleeding and anemia   OR FINDINGS: Circumferential prolapse of the entire dentate line   Description: Informed consent was confirmed. Patient underwent general anesthesia without difficulty. Patient was placed into lithotomy positioning.  The perianal region was prepped and draped in sterile fashion. Surgical time out confirmed or plan.  I did digital rectal examination and then transitioned over to anoscopy to get a sense of the anatomy.  I switched over to the [facility] fiberoptically lit Doppler anocope.   Using the Doppler on the tip of the [facility] anoscope, I identified the arterial hemorrhoidal vessels coming in in the classic hexagonal anatomical pattern (right posterior/lateral/anterior, left posterior /lateral/anterior).    I proceeded to ligate the hemorrhoidal arteries. I used a 2-0 Vicryl suture on a UR-6 needle in a figure-of-eight fashion over the signal around 6 cm proximal to the anal verge. I then ran that stitch longitudinally more distally to the dentate line. I then tied that stitch down to cause a hemorrhoidopexy. I did that for all 6 locations.    I redid Doppler anoscopy. I Identified a signal at the LL location.  I isolated and ligated this with a figure-of-eight stitch. Signals went away.  At completion of this, all hemorrhoids were reduced into  the rectum.  There is no more prolapse. External anatomy looked normal.  I repeated anoscopy and examination.   Hemostasis was good.  Patient is being extubated go to recovery room.  I am about to discuss the patient's status to the family.    Rosario Adie, MD  Colorectal and [facility]

## 2022-12-18 NOTE — Discharge Instructions (Addendum)
ANORECTAL SURGERY: POST OP INSTRUCTIONS Take your usually prescribed home medications unless otherwise directed. DIET: During the first few hours after surgery sip on some liquids until you are able to urinate.  It is normal to not urinate for several hours after this surgery.  If you feel uncomfortable, please contact the office for instructions.  After you are able to urinate,you may eat, if you feel like it.  Follow a light bland diet the first 24 hours after arrival home, such as soup, liquids, crackers, etc.  Be sure to include lots of fluids daily (6-8 glasses).  Avoid fast food or heavy meals, as your are more likely to get nauseated.  Eat a low fat diet the next few days after surgery.  Limit caffeine intake to 1-2 servings a day. PAIN CONTROL: Pain is best controlled by a usual combination of several different methods TOGETHER: Muscle relaxation: Soak in a warm bath (or Sitz bath) three times a day and after bowel movements.  Continue to do this until all pain is resolved. Over the counter pain medication Prescription pain medication Most patients will experience some swelling and discomfort in the anus/rectal area and incisions.  Heat such as warm towels, sitz baths, warm baths, etc to help relax tight/sore spots and speed recovery.  Some people prefer to use ice, especially in the first couple days after surgery, as it may decrease the pain and swelling, or alternate between ice & heat.  Experiment to what works for you.  Swelling and bruising can take several weeks to resolve.  Pain can take even longer to completely resolve. It is helpful to take an over-the-counter pain medication regularly for the first few weeks.  Choose one of the following that works best for you: Naproxen (Aleve, etc)  Two 220mg tabs twice a day Ibuprofen (Advil, etc) Three 200mg tabs four times a day (every meal & bedtime) A  prescription for pain medication (such as percocet, oxycodone, hydrocodone, etc) should be  given to you upon discharge.  Take your pain medication as prescribed.  If you are having problems/concerns with the prescription medicine (does not control pain, nausea, vomiting, rash, itching, etc), please call us [phone] to see if we need to switch you to a different pain medicine that will work better for you and/or control your side effect better. If you need a refill on your pain medication, please contact your pharmacy.  They will contact our office to request authorization. Prescriptions will not be filled after 5 pm or on week-ends. KEEP YOUR BOWELS REGULAR and AVOID CONSTIPATION The goal is one to two soft bowel movements a day.  You should at least have a bowel movement every other day. Avoid getting constipated.  Between the surgery and the pain medications, it is common to experience some constipation. This can be very painful after rectal surgery.  Increasing fluid intake and taking a fiber supplement (such as Metamucil, Citrucel, FiberCon, etc) 1-2 times a day regularly will usually help prevent this problem from occurring.  A stool softener like colace is also recommended.  This can be purchased over the counter at your pharmacy.  You can take it up to 3 times a day.  If you do not have a bowel movement after 24 hrs since your surgery, take one does of milk of magnesia.  If you still haven't had a bowel movement 8-12 hours after that dose, take another dose.  If you don't have a bowel movement 48 hrs after surgery,   purchase a Fleets enema from the drug store and administer gently per package instructions.  If you still are having trouble with your bowel movements after that, please call the office for further instructions. If you develop diarrhea or have many loose bowel movements, simplify your diet to bland foods & liquids for a few days.  Stop any stool softeners and decrease your fiber supplement.  Switching to mild anti-diarrheal medications (Kayopectate, Pepto Bismol) can help.   If this worsens or does not improve, please call us.  Wound Care Remove your bandages before your first bowel movement or 8 hours after surgery.     Remove any wound packing material at this tim,e as well.  You do not need to repack the wound unless instructed otherwise.  Wear an absorbent pad or soft cotton gauze in your underwear to catch any drainage and help keep the area clean. You should change this every 2-3 hours while awake. Keep the area clean and dry.  Bathe / shower every day, especially after bowel movements.  Keep the area clean by showering / bathing over the incision / wound.   It is okay to soak an open wound to help wash it.  Wet wipes or showers / gentle washing after bowel movements is often less traumatic than regular toilet paper. You may have some styrofoam-like soft packing in the rectum which will come out with the first bowel movement.  You will often notice bleeding with bowel movements.  This should slow down by the end of the first week of surgery Expect some drainage.  This should slow down, too, by the end of the first week of surgery.  Wear an absorbent pad or soft cotton gauze in your underwear until the drainage stops. Do Not sit on a rubber or pillow ring.  This can make you symptoms worse.  You may sit on a soft pillow if needed.  ACTIVITIES as tolerated:   You may resume regular (light) daily activities beginning the next day--such as daily self-care, walking, climbing stairs--gradually increasing activities as tolerated.  If you can walk 30 minutes without difficulty, it is safe to try more intense activity such as jogging, treadmill, bicycling, low-impact aerobics, swimming, etc. Save the most intensive and strenuous activity for last such as sit-ups, heavy lifting, contact sports, etc  Refrain from any heavy lifting or straining until you are off narcotics for pain control.   You may drive when you are no longer taking prescription pain medication, you can  comfortably sit for long periods of time, and you can safely maneuver your car and apply brakes. You may have sexual intercourse when it is comfortable.  FOLLOW UP in our office Please call [facility] at [phone] to set up an appointment to see your surgeon in the office for a follow-up appointment approximately 3-4 weeks after your surgery. Make sure that you call for this appointment the day you arrive home to insure a convenient appointment time. 10. IF YOU HAVE DISABILITY OR FAMILY LEAVE FORMS, BRING THEM TO THE OFFICE FOR PROCESSING.  DO NOT GIVE THEM TO YOUR DOCTOR.     WHEN TO CALL us [phone]: Poor pain control Reactions / problems with new medications (rash/itching, nausea, etc)  Fever over 101.5 F (38.5 C) Inability to urinate Nausea and/or vomiting Worsening swelling or bruising Continued bleeding from incision. Increased pain, redness, or drainage from the incision  The clinic staff is available to answer your questions during regular business hours (8:30am-5pm).  Please don't hesitate to call and ask to speak to one of our nurses for clinical concerns.   A surgeon from [facility] is always on call at the hospitals   If you have a medical emergency, go to the nearest emergency room or call 911.    [facility], [address], [address], [place], [place]  [address] ? MAIN: [phone] ? TOLL FREE: [phone] ? FAX [phone] [email or web address]   Post Anesthesia Home Care Instructions  Activity: Get plenty of rest for the remainder of the day. A responsible individual must stay with you for 24 hours following the procedure.  For the next 24 hours, DO NOT: -Drive a car -Paediatric nurse -Drink alcoholic beverages -Take any medication unless instructed by your physician -Make any legal decisions or sign important papers.  Meals: Start with liquid foods such as gelatin or soup. Progress to regular foods  as tolerated. Avoid greasy, spicy, heavy foods. If nausea and/or vomiting occur, drink only clear liquids until the nausea and/or vomiting subsides. Call your physician if vomiting continues.  Special Instructions/Symptoms: Your throat may feel dry or sore from the anesthesia or the breathing tube placed in your throat during surgery. If this causes discomfort, gargle with warm salt water. The discomfort should disappear within 24 hours.  May begin taking Tylenol at 1 Pm as needed for soreness/discomfort.  Information for Discharge Teaching: EXPAREL (bupivacaine liposome injectable suspension)   Your surgeon or anesthesiologist gave you EXPAREL(bupivacaine) to help control your pain after surgery.  EXPAREL is a local anesthetic that provides pain relief by numbing the tissue around the surgical site. EXPAREL is designed to release pain medication over time and can control pain for up to 72 hours. Depending on how you respond to EXPAREL, you may require less pain medication during your recovery.  Possible side effects: Temporary loss of sensation or ability to move in the area where bupivacaine was injected. Nausea, vomiting, constipation Rarely, numbness and tingling in your mouth or lips, lightheadedness, or anxiety may occur. Call your doctor right away if you think you may be experiencing any of these sensations, or if you have other questions regarding possible side effects.  Follow all other discharge instructions given to you by your surgeon or nurse. Eat a healthy diet and drink plenty of water or other fluids.  If you return to the hospital for any reason within 96 hours following the administration of EXPAREL, it is important for health care providers to know that you have received this anesthetic. A teal colored band has been placed on your arm with the date, time and amount of EXPAREL you have received in order to alert and inform your health care providers. Please leave this  armband in place for the full 96 hours following administration, and then you may remove the band.  Do not remove teal armband before Tuesday, December 22, 2022.

## 2022-12-18 NOTE — Anesthesia Preprocedure Evaluation (Signed)
Anesthesia Evaluation  Patient identified by MRN, date of birth, ID band Patient awake    Reviewed: Allergy & Precautions, H&P , NPO status , Patient's Chart, lab work & pertinent test results  Airway Mallampati: II   Neck ROM: full    Dental   Pulmonary neg pulmonary ROS, former smoker   breath sounds clear to auscultation       Cardiovascular negative cardio ROS  Rhythm:regular Rate:Normal     Neuro/Psych    GI/Hepatic ,GERD  ,,  Endo/Other    Renal/GU      Musculoskeletal   Abdominal   Peds  Hematology   Anesthesia Other Findings   Reproductive/Obstetrics                             Anesthesia Physical Anesthesia Plan  ASA: 2  Anesthesia Plan: MAC   Post-op Pain Management:    Induction: Intravenous  PONV Risk Score and Plan: 2 and Propofol infusion, Midazolam and Treatment may vary due to age or medical condition  Airway Management Planned: Simple Face Mask  Additional Equipment:   Intra-op Plan:   Post-operative Plan:   Informed Consent: I have reviewed the patients History and Physical, chart, labs and discussed the procedure including the risks, benefits and alternatives for the proposed anesthesia with the patient or authorized representative who has indicated his/her understanding and acceptance.     Dental advisory given  Plan Discussed with: CRNA, Anesthesiologist and Surgeon  Anesthesia Plan Comments:        Anesthesia Quick Evaluation

## 2022-12-18 NOTE — Anesthesia Postprocedure Evaluation (Signed)
Anesthesia Post Note  Patient: Donna Baker  Procedure(s) Performed: TRANSANAL HEMORRHOIDAL DEARTERIALIZATION (Rectum)     Patient location during evaluation: PACU Anesthesia Type: MAC Level of consciousness: awake and alert Pain management: pain level controlled Vital Signs Assessment: post-procedure vital signs reviewed and stable Respiratory status: spontaneous breathing, nonlabored ventilation, respiratory function stable and patient connected to nasal cannula oxygen Cardiovascular status: stable and blood pressure returned to baseline Postop Assessment: no apparent nausea or vomiting Anesthetic complications: no   No notable events documented.  Last Vitals:  Vitals:   12/18/22 1230 12/18/22 1324  BP: 125/76 126/77  Pulse: 66 65  Resp: 13 16  Temp: (!) 36.3 C 36.6 C  SpO2: 95% 97%    Last Pain:  Vitals:   12/18/22 1324  TempSrc:   PainSc: Petrey

## 2022-12-21 ENCOUNTER — Encounter (HOSPITAL_BASED_OUTPATIENT_CLINIC_OR_DEPARTMENT_OTHER): Payer: Self-pay | Admitting: General Surgery

## 2022-12-22 ENCOUNTER — Other Ambulatory Visit: Payer: Self-pay | Admitting: Obstetrics & Gynecology

## 2022-12-22 NOTE — Telephone Encounter (Signed)
AEX 12/08/21 Scheduled AEX 03/12/23

## 2023-03-12 ENCOUNTER — Ambulatory Visit (INDEPENDENT_AMBULATORY_CARE_PROVIDER_SITE_OTHER): Payer: Medicare Other | Admitting: Obstetrics & Gynecology

## 2023-03-12 ENCOUNTER — Encounter: Payer: Self-pay | Admitting: Obstetrics & Gynecology

## 2023-03-12 VITALS — BP 134/88 | HR 69 | Resp 96 | Ht 59.0 in | Wt 133.0 lb

## 2023-03-12 DIAGNOSIS — N951 Menopausal and female climacteric states: Secondary | ICD-10-CM | POA: Diagnosis not present

## 2023-03-12 DIAGNOSIS — Z9189 Other specified personal risk factors, not elsewhere classified: Secondary | ICD-10-CM

## 2023-03-12 DIAGNOSIS — Z90710 Acquired absence of both cervix and uterus: Secondary | ICD-10-CM

## 2023-03-12 DIAGNOSIS — M8589 Other specified disorders of bone density and structure, multiple sites: Secondary | ICD-10-CM

## 2023-03-12 DIAGNOSIS — Z01419 Encounter for gynecological examination (general) (routine) without abnormal findings: Secondary | ICD-10-CM

## 2023-03-12 MED ORDER — ESTRADIOL 0.025 MG/24HR TD PTTW: MEDICATED_PATCH | TRANSDERMAL | 4 refills | Status: AC

## 2023-03-12 NOTE — Progress Notes (Signed)
Donna Baker [birth year] [id number]   History:    67 y.o.   G3P2A1L2 Just retired. Boyfriend of 23 years   RP:  Established patient presenting for annual gyn exam    HPI: S/P TAH for Fibroids.  Menopause on Estradiol x 08/2012. Well on Vivelle 0.025 patch.  No pelvic pain.  Pap Neg 11/2021. Urine/BMs wnl.  Breasts normal.  Mammo Neg 08/2022.  BMI 26.86. Good fitness and Low calorie diet now.  Fasting Health labs with Fam MD.  H/O Hypercholesterolemia, not on treatment currently.  Colono 10/2018.  BD on 02/06/20 showed Osteopenia with T-Score at -1.6.  [facility]   Past medical history,surgical history, family history and social history were all reviewed and documented in the EPIC chart.  Gynecologic History No LMP recorded. Patient has had a hysterectomy.  Obstetric History OB History  Gravida Para Term Preterm AB Living  3 2     1 2   SAB IAB Ectopic Multiple Live Births  1            # Outcome Date GA Lbr Len/2nd Weight Sex Delivery Anes PTL Lv  3 SAB           2 Para           1 Para              ROS: A ROS was performed and pertinent positives and negatives are included in the history. GENERAL: No fevers or chills. HEENT: No change in vision, no earache, sore throat or sinus congestion. NECK: No pain or stiffness. CARDIOVASCULAR: No chest pain or pressure. No palpitations. PULMONARY: No shortness of breath, cough or wheeze. GASTROINTESTINAL: No abdominal pain, nausea, vomiting or diarrhea, melena or bright red blood per rectum. GENITOURINARY: No urinary frequency, urgency, hesitancy or dysuria. MUSCULOSKELETAL: No joint or muscle pain, no back pain, no recent trauma. DERMATOLOGIC: No rash, no itching, no lesions. ENDOCRINE: No polyuria, polydipsia, no heat or cold intolerance. No recent change in weight. HEMATOLOGICAL: No anemia or easy bruising or bleeding. NEUROLOGIC: No headache, seizures, numbness, tingling or weakness. PSYCHIATRIC: No depression, no  loss of interest in normal activity or change in sleep pattern.     Exam:   BP 134/88 (BP Location: Right Arm, Patient Position: Sitting, Cuff Size: Normal)   Pulse 69   Resp (!) 96   Ht 4\' 11"  (1.499 m)   Wt 133 lb (60.3 kg)   BMI 26.86 kg/m   Body mass index is 26.86 kg/m.  General appearance : Well developed well nourished female. No acute distress HEENT: Eyes: no retinal hemorrhage or exudates,  Neck supple, trachea midline, no carotid bruits, no thyroidmegaly Lungs: Clear to auscultation, no rhonchi or wheezes, or rib retractions  Heart: Regular rate and rhythm, no murmurs or gallops Breast:Examined in sitting and supine position were symmetrical in appearance, no palpable masses or tenderness,  no skin retraction, no nipple inversion, no nipple discharge, no skin discoloration, no axillary or supraclavicular lymphadenopathy Abdomen: no palpable masses or tenderness, no rebound or guarding Extremities: no edema or skin discoloration or tenderness  Pelvic: Vulva: Normal             Vagina: No gross lesions or discharge  Cervix/Uterus absent  Adnexa  Without masses or tenderness  Anus: Normal   Assessment/Plan:  67 y.o. female for annual exam   1. Well female exam with routine gynecological exam S/P TAH for Fibroids.  Menopause on Estradiol x  08/2012. Well on Vivelle 0.025 patch (using only 1 patch weekly).  No pelvic pain.  Pap Neg 11/2021. Urine/BMs wnl.  Breasts normal.  Mammo Neg 08/2022.  BMI 26.86. Good fitness and Low calorie diet now.  Fasting Health labs with Fam MD.  H/O Hypercholesterolemia, not on treatment currently.  Colono 10/2018.  BD on 02/06/20 showed Osteopenia with T-Score at -1.6.  [facility]  2. S/P TAH (total abdominal hysterectomy)  3. Menopausal syndrome S/P TAH for Fibroids.  Menopause on Estradiol x 08/2012. Well on Vivelle 0.025 patch (using only 1 patch weekly). Recommend 1/2 a patch twice weekly.  Patient wants to  continue on that low Estradiol replacement therapy.  No CI.  Prescription sent to pharmacy.  4. Osteopenia of multiple sites BD on 02/06/20 showed Osteopenia with T-Score at -1.6.  [facility]. Continue regular weight bearing activities, Vit D, Ca++ 1.2 to 1.5 g/d. - DG Bone Density; Future  Other orders - Iron-Vitamin C (VITRON-C) 65-125 MG TABS; 1 tablet Orally every other day - omeprazole (PRILOSEC) 20 MG capsule; 1 capsule 30 minutes before morning meal Orally Once a day - Wheat Dextrin (BENEFIBER) POWD; as directed Orally - estradiol (VIVELLE-DOT) 0.025 MG/24HR; PLACE 1 PATCH ONTO THE SKIN 2 TIMES A WEEK.   Genia Del MD, 9:37 AM

## 2023-08-06 ENCOUNTER — Other Ambulatory Visit: Payer: Self-pay | Admitting: Family Medicine

## 2023-08-06 DIAGNOSIS — Z1231 Encounter for screening mammogram for malignant neoplasm of breast: Secondary | ICD-10-CM

## 2023-08-26 ENCOUNTER — Ambulatory Visit: Payer: Self-pay

## 2023-09-23 ENCOUNTER — Other Ambulatory Visit (HOSPITAL_COMMUNITY): Payer: Self-pay | Admitting: Pain Medicine

## 2023-09-23 ENCOUNTER — Other Ambulatory Visit (HOSPITAL_BASED_OUTPATIENT_CLINIC_OR_DEPARTMENT_OTHER): Payer: Self-pay | Admitting: Pain Medicine

## 2023-09-23 DIAGNOSIS — R55 Syncope and collapse: Secondary | ICD-10-CM

## 2023-09-23 DIAGNOSIS — E785 Hyperlipidemia, unspecified: Secondary | ICD-10-CM

## 2023-09-28 ENCOUNTER — Ambulatory Visit
Admission: RE | Admit: 2023-09-28 | Discharge: 2023-09-28 | Disposition: A | Discharge status: Home / Self Care | Payer: Medicare Other | Source: Ambulatory Visit | Attending: Family Medicine | Admitting: Family Medicine

## 2023-09-28 DIAGNOSIS — Z1231 Encounter for screening mammogram for malignant neoplasm of breast: Secondary | ICD-10-CM

## 2023-10-01 ENCOUNTER — Other Ambulatory Visit: Payer: Self-pay | Admitting: Family Medicine

## 2023-10-01 DIAGNOSIS — Z01419 Encounter for gynecological examination (general) (routine) without abnormal findings: Secondary | ICD-10-CM

## 2023-10-01 DIAGNOSIS — M8589 Other specified disorders of bone density and structure, multiple sites: Secondary | ICD-10-CM

## 2023-10-01 DIAGNOSIS — Z90710 Acquired absence of both cervix and uterus: Secondary | ICD-10-CM

## 2023-10-01 DIAGNOSIS — N951 Menopausal and female climacteric states: Secondary | ICD-10-CM

## 2023-10-06 ENCOUNTER — Ambulatory Visit (HOSPITAL_COMMUNITY)
Admission: RE | Admit: 2023-10-06 | Discharge: 2023-10-06 | Disposition: A | Discharge status: Home / Self Care | Payer: Medicare Other | Source: Ambulatory Visit | Attending: Pain Medicine | Admitting: Pain Medicine

## 2023-10-06 DIAGNOSIS — E785 Hyperlipidemia, unspecified: Secondary | ICD-10-CM | POA: Insufficient documentation

## 2023-10-07 ENCOUNTER — Ambulatory Visit
Admission: RE | Admit: 2023-10-07 | Discharge: 2023-10-07 | Disposition: A | Discharge status: Home / Self Care | Payer: Medicare Other | Source: Ambulatory Visit | Attending: Obstetrics & Gynecology | Admitting: Obstetrics & Gynecology

## 2023-10-07 DIAGNOSIS — M8589 Other specified disorders of bone density and structure, multiple sites: Secondary | ICD-10-CM

## 2023-10-08 ENCOUNTER — Ambulatory Visit (HOSPITAL_COMMUNITY)
Admission: RE | Admit: 2023-10-08 | Discharge: 2023-10-08 | Disposition: A | Discharge status: Home / Self Care | Payer: Medicare Other | Source: Ambulatory Visit | Attending: Pain Medicine | Admitting: Pain Medicine

## 2023-10-08 DIAGNOSIS — I351 Nonrheumatic aortic (valve) insufficiency: Secondary | ICD-10-CM | POA: Diagnosis not present

## 2023-10-08 DIAGNOSIS — R55 Syncope and collapse: Secondary | ICD-10-CM | POA: Insufficient documentation

## 2023-10-08 DIAGNOSIS — E559 Vitamin D deficiency, unspecified: Secondary | ICD-10-CM | POA: Insufficient documentation

## 2023-10-08 DIAGNOSIS — Z8616 Personal history of COVID-19: Secondary | ICD-10-CM | POA: Insufficient documentation

## 2023-10-08 DIAGNOSIS — E785 Hyperlipidemia, unspecified: Secondary | ICD-10-CM | POA: Insufficient documentation

## 2023-10-08 LAB — ECHOCARDIOGRAM COMPLETE
Area-P 1/2: 3.21 cm2
Calc EF: 61.5 %
MV M vel: 2.08 m/s
MV Peak grad: 17.3 mm[Hg]
S' Lateral: 2.7 cm
Single Plane A2C EF: 61.1 %
Single Plane A4C EF: 62.7 %

## 2023-10-08 NOTE — Progress Notes (Signed)
  Echocardiogram 2D Echocardiogram has been performed.  Donna Baker 10/08/2023, 10:41 AM

## 2024-03-28 ENCOUNTER — Encounter: Admitting: Radiology

## 2024-04-25 ENCOUNTER — Encounter (INDEPENDENT_AMBULATORY_CARE_PROVIDER_SITE_OTHER): Payer: Self-pay

## 2024-04-25 ENCOUNTER — Encounter: Admitting: Radiology

## 2024-09-26 ENCOUNTER — Other Ambulatory Visit: Payer: Self-pay | Admitting: Family Medicine

## 2024-09-26 DIAGNOSIS — Z1231 Encounter for screening mammogram for malignant neoplasm of breast: Secondary | ICD-10-CM

## 2024-10-19 ENCOUNTER — Ambulatory Visit
Admission: RE | Admit: 2024-10-19 | Discharge: 2024-10-19 | Disposition: A | Source: Ambulatory Visit | Attending: Family Medicine | Admitting: Family Medicine

## 2024-10-19 DIAGNOSIS — Z1231 Encounter for screening mammogram for malignant neoplasm of breast: Secondary | ICD-10-CM
